# Patient Record
Sex: Female | Born: 1990 | Race: Black or African American | Hispanic: No | Marital: Single | Smoking: Current every day smoker
Health system: Southern US, Community
[De-identification: ages and names within clinical notes are randomized; demographics above are authoritative.]

## PROBLEM LIST (undated history)

## (undated) ENCOUNTER — Inpatient Hospital Stay (HOSPITAL_COMMUNITY): Payer: Self-pay

## (undated) DIAGNOSIS — A599 Trichomoniasis, unspecified: Secondary | ICD-10-CM

## (undated) DIAGNOSIS — A749 Chlamydial infection, unspecified: Secondary | ICD-10-CM

## (undated) DIAGNOSIS — F329 Major depressive disorder, single episode, unspecified: Secondary | ICD-10-CM

## (undated) DIAGNOSIS — D649 Anemia, unspecified: Secondary | ICD-10-CM

## (undated) DIAGNOSIS — R87629 Unspecified abnormal cytological findings in specimens from vagina: Secondary | ICD-10-CM

## (undated) HISTORY — PX: INDUCED ABORTION: SHX677

## (undated) HISTORY — PX: WISDOM TOOTH EXTRACTION: SHX21

---
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
## 2010-03-06 ENCOUNTER — Emergency Department (HOSPITAL_COMMUNITY): Admission: EM | Admit: 2010-03-06 | Discharge: 2010-03-06 | Payer: Self-pay | Admitting: Emergency Medicine

## 2010-07-02 ENCOUNTER — Emergency Department (HOSPITAL_COMMUNITY)
Admission: EM | Admit: 2010-07-02 | Discharge: 2010-07-02 | Payer: Self-pay | Source: Home / Self Care | Admitting: Emergency Medicine

## 2010-09-26 ENCOUNTER — Telehealth (INDEPENDENT_AMBULATORY_CARE_PROVIDER_SITE_OTHER): Payer: Self-pay | Admitting: Internal Medicine

## 2010-10-25 NOTE — Letter (Signed)
Summary: REFERRAL//PHYCHOLOGY //APPT DATE &* TIME  REFERRAL//PHYCHOLOGY //APPT DATE &* TIME   Imported By: Arta Bruce 02/15/2010 15:21:41  _____________________________________________________________________  External Attachment:    Type:   Image     Comment:   External Document

## 2010-10-25 NOTE — Progress Notes (Signed)
Summary: Office Visit//depression screening  Office Visit//depression screening   Imported By: Arta Bruce 11/19/2009 14:18:10  _____________________________________________________________________  External Attachment:    Type:   Image     Comment:   External Document
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
## 2010-10-28 ENCOUNTER — Encounter (INDEPENDENT_AMBULATORY_CARE_PROVIDER_SITE_OTHER): Payer: Self-pay | Admitting: Internal Medicine

## 2010-11-02 NOTE — Progress Notes (Signed)
Summary: re depression  Phone Note Outgoing Call   Summary of Call: Please call Ms. Blackstock and see if she is following anywhere for he depression.  Saw Marchelle Folks here once, then never returned.  Please check with her to see if Warnell Forester is being followed at [facility]. Initial call taken by: Julieanne Manson MD,  September 26, 2010 3:27 PM  Follow-up for Phone Call        [phone] not a working # Gaylyn Cheers RN  September 27, 2010 1:40 PM   number is disconnected. called [phone] did not get an answer.Marland KitchenMarland KitchenArmenia Shannon  September 29, 2010 11:07 AM  Levon Hedger  October 03, 2010 3:50 PM No answer at [phone]   Additional Follow-up for Phone Call Additional follow up Details #1::        [phone] # or code dialed is incorrect, [phone] no answer, [phone] contact no longer works there, contact # [phone] Left message on answer machine for pt. to return call. Gaylyn Cheers RN  October 05, 2010 9:45 AM   Left message with female [phone] (pt's work #)        for patient to return call. Gaylyn Cheers RN  October 06, 2010 9:42 AM           Additional Follow-up for Phone Call Additional follow up Details #2::    Please send out a letter as we have not received a call yet.  Julieanne Manson MD  October 28, 2010 6:17 PM

## 2010-11-02 NOTE — Letter (Signed)
Summary: *HSN Results Follow up  [facility]  [address] [place], [place] [address]   Phone: [phone]  Fax: [phone]      10/28/2010   Tiffany Villegas [address] [place], [place]  [address]   Dear  [name],                            ____S.Drinkard,FNP   ____D. Gore,FNP       ____B. McPherson,MD   ____V. Rankins,MD    __X__E. Kester Stimpson,MD    ____N. Daphine Deutscher, FNP  ____D. Reche Dixon, MD    ____K. Philipp Deputy, MD    ____Other     This letter is to inform you that your recent test(s):  _______Pap Smear    _______Lab Test     _______X-ray    _______ is within acceptable limits  _______ requires a medication change  _______ requires a follow-up lab visit  _______ requires a follow-up visit with your provider   Comments:  We ha;ve been trying to reach you--please call  the office as soon as possible       _________________________________________________________ If you have any questions, please contact our office                     Sincerely,  Julieanne Manson MD [facility]

## 2010-12-04 ENCOUNTER — Emergency Department (HOSPITAL_COMMUNITY): Payer: Self-pay

## 2010-12-04 ENCOUNTER — Emergency Department (HOSPITAL_COMMUNITY)
Admission: EM | Admit: 2010-12-04 | Discharge: 2010-12-04 | Disposition: A | Discharge status: Home / Self Care | Payer: Self-pay | Attending: Emergency Medicine | Admitting: Emergency Medicine

## 2010-12-04 DIAGNOSIS — R059 Cough, unspecified: Secondary | ICD-10-CM | POA: Insufficient documentation

## 2010-12-04 DIAGNOSIS — B9789 Other viral agents as the cause of diseases classified elsewhere: Secondary | ICD-10-CM | POA: Insufficient documentation

## 2010-12-04 DIAGNOSIS — R112 Nausea with vomiting, unspecified: Secondary | ICD-10-CM | POA: Insufficient documentation

## 2010-12-04 DIAGNOSIS — R05 Cough: Secondary | ICD-10-CM | POA: Insufficient documentation

## 2010-12-04 LAB — CBC
HCT: 37.6 % (ref 36.0–46.0)
Hemoglobin: 12.1 g/dL (ref 12.0–15.0)
MCH: 29.2 pg (ref 26.0–34.0)
MCHC: 32.2 g/dL (ref 30.0–36.0)
MCV: 90.6 fL (ref 78.0–100.0)
Platelets: 297 K/uL (ref 150–400)
RBC: 4.15 MIL/uL (ref 3.87–5.11)
RDW: 12.5 % (ref 11.5–15.5)
WBC: 6.4 10*3/uL (ref 4.0–10.5)

## 2010-12-04 LAB — URINALYSIS, ROUTINE W REFLEX MICROSCOPIC
Bilirubin Urine: NEGATIVE
Glucose, UA: NEGATIVE mg/dL
Ketones, ur: NEGATIVE mg/dL
Leukocytes, UA: NEGATIVE
Nitrite: NEGATIVE
Protein, ur: NEGATIVE mg/dL
Specific Gravity, Urine: 1.026 (ref 1.005–1.030)
Urobilinogen, UA: 1 mg/dL (ref 0.0–1.0)
pH: 6.5 (ref 5.0–8.0)

## 2010-12-04 LAB — COMPREHENSIVE METABOLIC PANEL WITH GFR
AST: 19 U/L (ref 0–37)
Albumin: 3.5 g/dL (ref 3.5–5.2)
BUN: 11 mg/dL (ref 6–23)
CO2: 25 meq/L (ref 19–32)
Calcium: 8.8 mg/dL (ref 8.4–10.5)
Creatinine, Ser: 0.88 mg/dL (ref 0.4–1.2)
GFR calc Af Amer: >60 mL/min (ref >60)
GFR calc non Af Amer: >60 mL/min (ref >60)

## 2010-12-04 LAB — DIFFERENTIAL
Basophils Absolute: 0 10*3/uL (ref 0.0–0.1)
Basophils Relative: 1 % (ref 0–1)
Eosinophils Absolute: 0.2 K/uL (ref 0.0–0.7)
Eosinophils Relative: 3 % (ref 0–5)
Lymphocytes Relative: 32 % (ref 12–46)
Lymphs Abs: 2.1 K/uL (ref 0.7–4.0)
Monocytes Absolute: 0.8 K/uL (ref 0.1–1.0)
Monocytes Relative: 12 % (ref 3–12)
Neutro Abs: 3.4 10*3/uL (ref 1.7–7.7)
Neutrophils Relative %: 53 % (ref 43–77)

## 2010-12-04 LAB — COMPREHENSIVE METABOLIC PANEL
ALT: 14 U/L (ref 0–35)
Alkaline Phosphatase: 42 U/L (ref 39–117)
Chloride: 109 mEq/L (ref 96–112)
Glucose, Bld: 86 mg/dL (ref 70–99)
Potassium: 4.1 mEq/L (ref 3.5–5.1)
Sodium: 140 mEq/L (ref 135–145)
Total Bilirubin: 0.3 mg/dL (ref 0.3–1.2)
Total Protein: 6.6 g/dL (ref 6.0–8.3)

## 2010-12-04 LAB — URINE MICROSCOPIC-ADD ON

## 2010-12-04 LAB — POCT I-STAT, CHEM 8
BUN: 11 mg/dL (ref 6–23)
Chloride: 108 mEq/L (ref 96–112)
Creatinine, Ser: 1 mg/dL (ref 0.4–1.2)
Hemoglobin: 13.3 g/dL (ref 12.0–15.0)
Potassium: 4.2 mEq/L (ref 3.5–5.1)
Sodium: 141 mEq/L (ref 135–145)

## 2010-12-04 LAB — POCT PREGNANCY, URINE: Preg Test, Ur: NEGATIVE

## 2010-12-28 LAB — COMPREHENSIVE METABOLIC PANEL
ALT: 15 U/L (ref 0–35)
AST: 16 U/L (ref 0–37)
Albumin: 3.7 g/dL (ref 3.5–5.2)
Albumin: 3.9 g/dL (ref 3.5–5.2)
Alkaline Phosphatase: 57 U/L (ref 39–117)
BUN: 11 mg/dL (ref 6–23)
CO2: 26 mEq/L (ref 19–32)
Chloride: 108 mEq/L (ref 96–112)
Creatinine, Ser: 0.86 mg/dL (ref 0.4–1.2)
Creatinine, Ser: 0.96 mg/dL (ref 0.4–1.2)
GFR calc Af Amer: >60 mL/min (ref >60)
GFR calc non Af Amer: >60 mL/min (ref >60)
Potassium: 3.8 mEq/L (ref 3.5–5.1)
Potassium: 4.3 mEq/L (ref 3.5–5.1)
Sodium: 140 mEq/L (ref 135–145)
Total Bilirubin: 0.8 mg/dL (ref 0.3–1.2)
Total Protein: 7.1 g/dL (ref 6.0–8.3)

## 2010-12-28 LAB — URINE CULTURE
Culture: NO GROWTH
Special Requests: POSITIVE

## 2010-12-28 LAB — URINALYSIS, ROUTINE W REFLEX MICROSCOPIC
Bilirubin Urine: NEGATIVE
Glucose, UA: NEGATIVE mg/dL
Ketones, ur: 15 mg/dL — AB
Nitrite: NEGATIVE
Protein, ur: NEGATIVE mg/dL
Specific Gravity, Urine: 1.025 (ref 1.005–1.030)
Urobilinogen, UA: 1 mg/dL (ref 0.0–1.0)
pH: 6 (ref 5.0–8.0)

## 2010-12-28 LAB — DIFFERENTIAL
Basophils Absolute: 0 10*3/uL (ref 0.0–0.1)
Eosinophils Absolute: 0.1 10*3/uL (ref 0.0–0.7)
Eosinophils Relative: 1 % (ref 0–5)
Lymphocytes Relative: 12 % (ref 12–46)
Lymphocytes Relative: 45 % (ref 12–46)
Lymphs Abs: 1.3 10*3/uL (ref 0.7–4.0)
Monocytes Absolute: 0.2 10*3/uL (ref 0.1–1.0)
Monocytes Absolute: 0.3 10*3/uL (ref 0.1–1.0)
Monocytes Relative: 2 % — ABNORMAL LOW (ref 3–12)
Neutro Abs: 9.4 10*3/uL — ABNORMAL HIGH (ref 1.7–7.7)

## 2010-12-28 LAB — CBC
HCT: 29.7 % — ABNORMAL LOW (ref 36.0–46.0)
HCT: 37.2 % (ref 36.0–46.0)
Hemoglobin: 10.1 g/dL — ABNORMAL LOW (ref 12.0–15.0)
MCV: 88.7 fL (ref 78.0–100.0)
Platelets: 235 10*3/uL (ref 150–400)
Platelets: 258 10*3/uL (ref 150–400)
Platelets: 373 10*3/uL (ref 150–400)
RBC: 4.35 MIL/uL (ref 3.87–5.11)
RDW: 13.1 % (ref 11.5–15.5)
WBC: 13.6 10*3/uL — ABNORMAL HIGH (ref 4.0–10.5)
WBC: 5.3 10*3/uL (ref 4.0–10.5)

## 2010-12-28 LAB — CULTURE, BLOOD (ROUTINE X 2)
Culture: NO GROWTH
Culture: NO GROWTH

## 2010-12-28 LAB — URINE MICROSCOPIC-ADD ON

## 2010-12-28 LAB — GC/CHLAMYDIA PROBE AMP, GENITAL
Chlamydia, DNA Probe: NEGATIVE
GC Probe Amp, Genital: POSITIVE — AB

## 2010-12-28 LAB — WET PREP, GENITAL: Trich, Wet Prep: NONE SEEN
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
## 2011-03-19 ENCOUNTER — Emergency Department (HOSPITAL_COMMUNITY)
Admission: EM | Admit: 2011-03-19 | Discharge: 2011-03-19 | Disposition: A | Discharge status: Home / Self Care | Payer: Self-pay | Attending: Emergency Medicine | Admitting: Emergency Medicine

## 2011-03-19 DIAGNOSIS — N760 Acute vaginitis: Secondary | ICD-10-CM | POA: Insufficient documentation

## 2011-03-19 DIAGNOSIS — A499 Bacterial infection, unspecified: Secondary | ICD-10-CM | POA: Insufficient documentation

## 2011-03-19 DIAGNOSIS — B9689 Other specified bacterial agents as the cause of diseases classified elsewhere: Secondary | ICD-10-CM | POA: Insufficient documentation

## 2011-03-19 DIAGNOSIS — N390 Urinary tract infection, site not specified: Secondary | ICD-10-CM | POA: Insufficient documentation

## 2011-03-19 LAB — URINALYSIS, ROUTINE W REFLEX MICROSCOPIC
Nitrite: NEGATIVE
Specific Gravity, Urine: 1.029 (ref 1.005–1.030)
Urobilinogen, UA: 0.2 mg/dL (ref 0.0–1.0)
pH: 6.5 (ref 5.0–8.0)

## 2011-03-19 LAB — WET PREP, GENITAL

## 2011-03-19 LAB — URINE MICROSCOPIC-ADD ON

## 2011-03-19 LAB — POCT PREGNANCY, URINE: Preg Test, Ur: NEGATIVE

## 2011-03-20 ENCOUNTER — Emergency Department (HOSPITAL_COMMUNITY)
Admission: EM | Admit: 2011-03-20 | Discharge: 2011-03-20 | Disposition: A | Discharge status: Home / Self Care | Payer: Self-pay | Attending: Emergency Medicine | Admitting: Emergency Medicine

## 2011-03-20 DIAGNOSIS — N12 Tubulo-interstitial nephritis, not specified as acute or chronic: Secondary | ICD-10-CM | POA: Insufficient documentation

## 2011-03-21 LAB — GC/CHLAMYDIA PROBE AMP, GENITAL
Chlamydia, DNA Probe: UNDETERMINED
GC Probe Amp, Genital: NEGATIVE

## 2011-03-21 LAB — URINE CULTURE
Colony Count: NO GROWTH
Culture: NO GROWTH

## 2011-06-16 LAB — URINALYSIS, ROUTINE W REFLEX MICROSCOPIC
Bilirubin Urine: NEGATIVE
Ketones, ur: NEGATIVE
Nitrite: NEGATIVE
Protein, ur: NEGATIVE
Urobilinogen, UA: 0.2

## 2011-06-19 LAB — CREATININE, SERUM

## 2011-06-19 LAB — COMPREHENSIVE METABOLIC PANEL
ALT: 11
AST: 16
Albumin: 2.5 — ABNORMAL LOW
Alkaline Phosphatase: 478 — ABNORMAL HIGH
Alkaline Phosphatase: 495 — ABNORMAL HIGH
BUN: 11
BUN: 7
CO2: 22
Chloride: 108
Creatinine, Ser: 0.6
Glucose, Bld: 75
Potassium: 3.7
Potassium: 3.9
Sodium: 137
Total Bilirubin: 0.7
Total Protein: 5.6 — ABNORMAL LOW

## 2011-06-19 LAB — CBC
HCT: 33.5 — ABNORMAL LOW
HCT: 35.4 — ABNORMAL LOW
Hemoglobin: 11.7 — ABNORMAL LOW
Platelets: 211
RBC: 3.8
RDW: 13
RDW: 13.4
WBC: 6.9

## 2011-06-19 LAB — URINALYSIS, DIPSTICK ONLY
Glucose, UA: NEGATIVE
Ketones, ur: NEGATIVE
Leukocytes, UA: NEGATIVE
Nitrite: NEGATIVE
pH: 7

## 2011-06-19 LAB — URINALYSIS, ROUTINE W REFLEX MICROSCOPIC
Bilirubin Urine: NEGATIVE
Hgb urine dipstick: NEGATIVE
Ketones, ur: NEGATIVE
Protein, ur: NEGATIVE
Specific Gravity, Urine: 1.01
Urobilinogen, UA: 0.2

## 2011-06-19 LAB — URIC ACID
Uric Acid, Serum: 5
Uric Acid, Serum: 5.6

## 2011-06-19 LAB — RPR: RPR Ser Ql: NONREACTIVE

## 2011-07-03 LAB — CBC
HCT: 35.1 — ABNORMAL LOW
Hemoglobin: 11.8 — ABNORMAL LOW
MCHC: 33.7
MCV: 88.5
RBC: 3.97

## 2011-07-03 LAB — POCT I-STAT CREATININE
Creatinine, Ser: 0.7
Operator id: 196461

## 2011-07-03 LAB — I-STAT 8, (EC8 V) (CONVERTED LAB)
Bicarbonate: 22.8
Glucose, Bld: 95
TCO2: 24
pH, Ven: 7.351 — ABNORMAL HIGH

## 2011-07-10 LAB — COMPREHENSIVE METABOLIC PANEL
AST: 14
BUN: 7
CO2: 26
Chloride: 111
Creatinine, Ser: 0.75
Total Bilirubin: 0.5

## 2011-07-10 LAB — COMPREHENSIVE METABOLIC PANEL WITH GFR
ALT: 10
Albumin: 3.7
Alkaline Phosphatase: 37 — ABNORMAL LOW
Calcium: 8.8
Glucose, Bld: 124 — ABNORMAL HIGH
Potassium: 3.6
Sodium: 140
Total Protein: 6.6

## 2011-07-10 LAB — CBC
HCT: 36.1
Hemoglobin: 12.4
MCHC: 34.3 — ABNORMAL HIGH
MCV: 84.9
Platelets: 292
RBC: 4.25
RDW: 12.9
WBC: 7.3

## 2011-07-10 LAB — URINALYSIS, ROUTINE W REFLEX MICROSCOPIC
Bilirubin Urine: NEGATIVE
Glucose, UA: NEGATIVE
Hgb urine dipstick: NEGATIVE
Ketones, ur: NEGATIVE
Nitrite: NEGATIVE
Protein, ur: NEGATIVE
Specific Gravity, Urine: 1.023
Urobilinogen, UA: 0.2
pH: 6

## 2011-07-10 LAB — ETHANOL: Alcohol, Ethyl (B): <5

## 2011-07-10 LAB — RAPID URINE DRUG SCREEN, HOSP PERFORMED
Amphetamines: NOT DETECTED
Barbiturates: NOT DETECTED
Benzodiazepines: NOT DETECTED
Cocaine: NOT DETECTED
Opiates: NOT DETECTED
Tetrahydrocannabinol: NOT DETECTED

## 2011-07-10 LAB — HEPATIC FUNCTION PANEL
ALT: 10
AST: 13
Albumin: 3.6
Alkaline Phosphatase: 35 — ABNORMAL LOW
Indirect Bilirubin: 1 — ABNORMAL HIGH
Total Protein: 6.4

## 2011-07-10 LAB — URINE MICROSCOPIC-ADD ON

## 2011-07-10 LAB — POCT PREGNANCY, URINE
Operator id: 189501
Preg Test, Ur: POSITIVE

## 2011-07-10 LAB — DIFFERENTIAL
Basophils Absolute: 0.1
Basophils Relative: 1
Eosinophils Absolute: 0.1
Eosinophils Relative: 1
Lymphocytes Relative: 34
Lymphs Abs: 2.5
Monocytes Absolute: 0.3
Monocytes Relative: 5
Neutro Abs: 4.3
Neutrophils Relative %: 60

## 2011-07-10 LAB — RPR: RPR Ser Ql: NONREACTIVE

## 2011-07-10 LAB — HCG, QUANTITATIVE, PREGNANCY
hCG, Beta Chain, Quant, S: 122 — ABNORMAL HIGH
hCG, Beta Chain, Quant, S: 896 — ABNORMAL HIGH

## 2011-07-10 LAB — GAMMA GT: GGT: 10

## 2011-07-10 LAB — ACETAMINOPHEN LEVEL: Acetaminophen (Tylenol), Serum: <10 — ABNORMAL LOW

## 2011-07-10 LAB — SALICYLATE LEVEL: Salicylate Lvl: <4

## 2011-07-10 LAB — TSH: TSH: 1.061

## 2011-11-30 ENCOUNTER — Encounter (HOSPITAL_COMMUNITY): Payer: Self-pay | Admitting: Emergency Medicine

## 2011-11-30 ENCOUNTER — Emergency Department (HOSPITAL_COMMUNITY)
Admission: EM | Admit: 2011-11-30 | Discharge: 2011-11-30 | Disposition: A | Discharge status: Home / Self Care | Payer: Medicaid Other | Attending: Emergency Medicine | Admitting: Emergency Medicine

## 2011-11-30 DIAGNOSIS — Z2089 Contact with and (suspected) exposure to other communicable diseases: Secondary | ICD-10-CM | POA: Insufficient documentation

## 2011-11-30 DIAGNOSIS — F17200 Nicotine dependence, unspecified, uncomplicated: Secondary | ICD-10-CM | POA: Insufficient documentation

## 2011-11-30 DIAGNOSIS — R21 Rash and other nonspecific skin eruption: Secondary | ICD-10-CM | POA: Insufficient documentation

## 2011-11-30 MED ORDER — PERMETHRIN 5 % EX CREA: TOPICAL_CREAM | CUTANEOUS | Status: AC

## 2011-11-30 NOTE — Discharge Instructions (Signed)
Scabies Scabies are small bugs (mites) that burrow under the skin and cause red bumps and severe itching. These bugs can only be seen with a microscope. Scabies are highly contagious. They can spread easily from person to person by direct contact. They are also spread through sharing clothing or linens that have the scabies mites living in them. It is not unusual for an entire family to become infected through shared towels, clothing, or bedding.  HOME CARE INSTRUCTIONS   Your caregiver may prescribe a cream or lotion to kill the mites. If this cream is prescribed; massage the cream into the entire area of the body from the neck to the bottom of both feet. Also massage the cream into the scalp and face if your child is less than 1 year old. Avoid the eyes and mouth.   Leave the cream on for 8 to12 hours. Do not wash your hands after application. Your child should bathe or shower after the 8 to 12 hour application period. Sometimes it is helpful to apply the cream to your child at right before bedtime.   One treatment is usually effective and will eliminate approximately 95% of infestations. For severe cases, your caregiver may decide to repeat the treatment in 1 week. Everyone in your household should be treated with one application of the cream.   New rashes or burrows should not appear after successful treatment within 24 to 48 hours; however the itching and rash may last for 2 to 4 weeks after successful treatment. If your symptoms persist longer than this, see your caregiver.   Your caregiver also may prescribe a medication to help with the itching or to help the rash go away more quickly.   Scabies can live on clothing or linens for up to 3 days. Your entire child's recently used clothing, towels, stuffed toys, and bed linens should be washed in hot water and then dried in a dryer for at least 20 minutes on high heat. Items that cannot be washed should be enclosed in a plastic bag for at least 3  days.   To help relieve itching, bathe your child in a cool bath or apply cool washcloths to the affected areas.   Your child may return to school after treatment with the prescribed cream.  SEEK MEDICAL CARE IF:   The itching persists longer than 4 weeks after treatment.   The rash spreads or becomes infected (the area has red blisters or yellow-tan crust).  Document Released: 09/11/2005 Document Revised: 08/31/2011 Document Reviewed: 01/20/2009 [employer] Patient Information 2012 [employer]. 

## 2011-11-30 NOTE — ED Provider Notes (Signed)
History     CSN: [id number]  Arrival date & time 11/30/11  2053   First MD Initiated Contact with Patient 11/30/11 2317      Chief Complaint  Patient presents with  . Rash    (Consider location/radiation/quality/duration/timing/severity/associated sxs/prior treatment) Patient is a 21 y.o. female presenting with rash. The history is provided by the patient. No language interpreter was used.  Rash  This is a new problem. The current episode started more than 2 days ago. The problem has been gradually worsening. There has been no fever. The rash is present on the back, abdomen, right upper leg and left upper leg. The patient is experiencing no pain. Associated symptoms include blisters and itching. Risk factors include new environmental exposures.  Son at home diagnosed with scabies today.  History reviewed. No pertinent past medical history.  History reviewed. No pertinent past surgical history.  No family history on file.  History  Substance Use Topics  . Smoking status: Current Everyday Smoker  . Smokeless tobacco: Not on file  . Alcohol Use: Yes    OB History    Grav Para Term Preterm Abortions TAB SAB Ect Mult Living                  Review of Systems  Skin: Positive for itching and rash.  All other systems reviewed and are negative.    Allergies  Review of patient's allergies indicates no known allergies.  Home Medications   Current Outpatient Rx  Name Route Sig Dispense Refill  . HYDROCORTISONE 1 % EX CREA Topical Apply 1 application topically 2 (two) times daily as needed. For itching      BP 133/87  Pulse 124  Temp(Src) 98.8 F (37.1 C) (Oral)  Resp 18  SpO2 100%  LMP 10/30/2011  Physical Exam  Nursing note and vitals reviewed. Constitutional: She is oriented to person, place, and time. She appears well-developed and well-nourished.  HENT:  Head: Normocephalic and atraumatic.  Eyes: Pupils are equal, round, and reactive to light.  Neck: Normal  range of motion. Neck supple.  Cardiovascular: Normal rate, regular rhythm, normal heart sounds and intact distal pulses.   Pulmonary/Chest: Effort normal and breath sounds normal.  Abdominal: Soft. Bowel sounds are normal.  Musculoskeletal: Normal range of motion.  Neurological: She is alert and oriented to person, place, and time.  Skin: Rash noted.  Psychiatric: She has a normal mood and affect. Her behavior is normal. Judgment and thought content normal.    ED Course  Procedures (including critical care time)  Labs Reviewed - No data to display No results found.   No diagnosis found.  Likely scabies exposure.  MDM          Jimmye Norman, NP 11/30/11 2336  Medical screening examination/treatment/procedure(s) were performed by non-physician practitioner and as supervising physician I was immediately available for consultation/collaboration.   Sunnie Nielsen, MD 12/01/11 [phone]

## 2011-11-30 NOTE — ED Notes (Signed)
PT. REPORTS ITCHY RASHES AT BACK AND THIGH FOR 1 WEEK CONCERNED ABOUT SCABIES , STATES SON DIAGNOSED WITH SCABIES TODAY.

## 2012-03-13 ENCOUNTER — Encounter (HOSPITAL_COMMUNITY): Payer: Self-pay | Admitting: *Deleted

## 2012-03-13 ENCOUNTER — Inpatient Hospital Stay (HOSPITAL_COMMUNITY)
Admission: AD | Admit: 2012-03-13 | Discharge: 2012-03-13 | Disposition: A | Discharge status: Home / Self Care | Payer: Medicaid Other | Source: Ambulatory Visit | Attending: Obstetrics & Gynecology | Admitting: Obstetrics & Gynecology

## 2012-03-13 DIAGNOSIS — R1010 Upper abdominal pain, unspecified: Secondary | ICD-10-CM

## 2012-03-13 DIAGNOSIS — Z3201 Encounter for pregnancy test, result positive: Secondary | ICD-10-CM

## 2012-03-13 DIAGNOSIS — O99891 Other specified diseases and conditions complicating pregnancy: Secondary | ICD-10-CM | POA: Insufficient documentation

## 2012-03-13 DIAGNOSIS — R109 Unspecified abdominal pain: Secondary | ICD-10-CM | POA: Insufficient documentation

## 2012-03-13 HISTORY — DX: Chlamydial infection, unspecified: A74.9

## 2012-03-13 LAB — URINALYSIS, ROUTINE W REFLEX MICROSCOPIC
Glucose, UA: NEGATIVE mg/dL
Ketones, ur: NEGATIVE mg/dL
Nitrite: NEGATIVE
Specific Gravity, Urine: 1.02 (ref 1.005–1.030)
pH: 6 (ref 5.0–8.0)

## 2012-03-13 LAB — URINE MICROSCOPIC-ADD ON

## 2012-03-13 LAB — POCT PREGNANCY, URINE: Preg Test, Ur: POSITIVE — AB

## 2012-03-13 LAB — WET PREP, GENITAL

## 2012-03-13 NOTE — MAU Provider Note (Signed)
History     CSN: [id number]  Arrival date and time: 03/13/12 1452   First Provider Initiated Contact with Patient 03/13/12 1536      Chief Complaint  Patient presents with  . Nausea   HPI Tiffany Villegas 21 y.o. [redacted]w[redacted]d Comes as she had a positive pregnancy test at home.  Is having upper abdominal pain.  No vaginal bleeding.  No lower abdominal pain.  No nausea or vomiting.  No discharge.  Does want STD testing.   OB History    Grav Para Term Preterm Abortions TAB SAB Ect Mult Living   3 1 1  0 0 0 0 0 0 1      Past Medical History  Diagnosis Date  . Chlamydia     History reviewed. No pertinent past surgical history.  Family History  Problem Relation Age of Onset  . Hypertension Mother     History  Substance Use Topics  . Smoking status: Current Everyday Smoker -- 0.2 packs/day    Types: Cigarettes  . Smokeless tobacco: Never Used  . Alcohol Use: Yes     Occas., but not recent    Allergies: No Known Allergies  Prescriptions prior to admission  Medication Sig Dispense Refill  . hydrocortisone cream 1 % Apply 1 application topically 2 (two) times daily as needed. For itching        ROS Physical Exam   Blood pressure 116/68, pulse 119, temperature 98.9 F (37.2 C), temperature source Oral, resp. rate 16, height 5\' 3"  (1.6 m), weight 115 lb 6.4 oz (52.345 kg), last menstrual period 02/05/2012.  Physical Exam  Nursing note and vitals reviewed. Constitutional: She is oriented to person, place, and time. She appears well-developed and well-nourished.  HENT:  Head: Normocephalic.  Eyes: EOM are normal.  Neck: Neck supple.  GI: Soft. There is no tenderness.  Genitourinary:       Speculum exam: Vagina - Small amount of creamy discharge, no odor Cervix - No contact bleeding Bimanual exam: Cervix closed Uterus non tender, 6 week size Adnexa non tender, no masses bilaterally GC/Chlam, wet prep done Chaperone present for exam.  Musculoskeletal: Normal  range of motion.  Neurological: She is alert and oriented to person, place, and time.  Skin: Skin is warm and dry.  Psychiatric: She has a normal mood and affect.    MAU Course  Procedures  MDM Results for orders placed during the hospital encounter of 03/13/12 (from the past 24 hour(s))  URINALYSIS, ROUTINE W REFLEX MICROSCOPIC     Status: Abnormal   Collection Time   03/13/12  3:05 PM      Component Value Range   Color, Urine YELLOW  YELLOW   APPearance HAZY (*) CLEAR   Specific Gravity, Urine 1.020  1.005 - 1.030   pH 6.0  5.0 - 8.0   Glucose, UA NEGATIVE  NEGATIVE mg/dL   Hgb urine dipstick TRACE (*) NEGATIVE   Bilirubin Urine NEGATIVE  NEGATIVE   Ketones, ur NEGATIVE  NEGATIVE mg/dL   Protein, ur NEGATIVE  NEGATIVE mg/dL   Urobilinogen, UA 0.2  0.0 - 1.0 mg/dL   Nitrite NEGATIVE  NEGATIVE   Leukocytes, UA TRACE (*) NEGATIVE  URINE MICROSCOPIC-ADD ON     Status: Abnormal   Collection Time   03/13/12  3:05 PM      Component Value Range   Squamous Epithelial / LPF MANY (*) RARE   WBC, UA 0-2  <3 WBC/hpf   RBC / HPF 0-2  <  3 RBC/hpf   Bacteria, UA MANY (*) RARE   Urine-Other MUCOUS PRESENT    POCT PREGNANCY, URINE     Status: Abnormal   Collection Time   03/13/12  3:10 PM      Component Value Range   Preg Test, Ur POSITIVE (*) NEGATIVE  WET PREP, GENITAL     Status: Abnormal   Collection Time   03/13/12  4:00 PM      Component Value Range   Yeast Wet Prep HPF POC NONE SEEN  NONE SEEN   Trich, Wet Prep NONE SEEN  NONE SEEN   Clue Cells Wet Prep HPF POC MODERATE (*) NONE SEEN   WBC, Wet Prep HPF POC FEW (*) NONE SEEN     Assessment and Plan  Early pregnancy Upper abdominal pain likely indigestion  Plan May take Tums for upper abdominal pain Begin prenatal care as soon as possible.  Client thinks she will have a TAB.  Venice Marcucci 03/13/2012, 4:13 PM

## 2012-03-13 NOTE — MAU Note (Signed)
LMP 02/05/12 has missed period this month has been having some nausea.

## 2012-03-13 NOTE — MAU Note (Signed)
Patient would like to be checked STDs while here.

## 2012-03-13 NOTE — Discharge Instructions (Signed)
Your pregnancy test is positive.  No smoking, no drugs, no alcohol.  Take a prenatal vitamin one by mouth every day.  Eat small frequent snacks to avoid nausea.  Begin prenatal care as soon as possible. Return if you have severe pain or heavy vaginal bleeding.

## 2012-03-14 LAB — GC/CHLAMYDIA PROBE AMP, GENITAL: Chlamydia, DNA Probe: NEGATIVE

## 2012-09-25 DIAGNOSIS — A749 Chlamydial infection, unspecified: Secondary | ICD-10-CM

## 2012-09-25 HISTORY — DX: Chlamydial infection, unspecified: A74.9

## 2012-09-25 NOTE — L&D Delivery Note (Signed)
Delivery Note At 5:20 PM a viable female was delivered via Vaginal, Spontaneous Delivery (Presentation: ROA).  APGAR: 9, 9; weight pending. Nuchal cord easily reduced. Baby direct skin-to-skin on maternal abdomen. Cord: Long and thick with 3 vessels clamped x2 and cut by myself, as mother did not want to cut it. Cord blood obtained. Intact placenta delivered shortly thereafter without complication. Cord pH not obtained.   Anesthesia: Epidural  Episiotomy: None Lacerations: None Suture Repair: N/A Est. Blood Loss (mL): 250  Mom to postpartum.  Baby to Couplet care / Skin to Skin.  Caren Griffins, CNM supervised the delivery in total.  [name] 09/09/2013, 5:39 PM Evaluation and management procedures were performed by Resident physician under my supervision/collaboration. Chart reviewed, patient examined by me and I agree with management and plan.  Danae Orleans, CNM 09/09/2013 6:11 PM

## 2012-11-10 ENCOUNTER — Encounter (HOSPITAL_COMMUNITY): Payer: Self-pay | Admitting: Emergency Medicine

## 2012-11-10 ENCOUNTER — Emergency Department (HOSPITAL_COMMUNITY)
Admission: EM | Admit: 2012-11-10 | Discharge: 2012-11-10 | Disposition: A | Discharge status: Home / Self Care | Payer: Medicaid Other | Attending: Emergency Medicine | Admitting: Emergency Medicine

## 2012-11-10 DIAGNOSIS — H5789 Other specified disorders of eye and adnexa: Secondary | ICD-10-CM | POA: Insufficient documentation

## 2012-11-10 DIAGNOSIS — H00016 Hordeolum externum left eye, unspecified eyelid: Secondary | ICD-10-CM

## 2012-11-10 DIAGNOSIS — H00019 Hordeolum externum unspecified eye, unspecified eyelid: Secondary | ICD-10-CM | POA: Insufficient documentation

## 2012-11-10 DIAGNOSIS — Z8619 Personal history of other infectious and parasitic diseases: Secondary | ICD-10-CM | POA: Insufficient documentation

## 2012-11-10 DIAGNOSIS — F17200 Nicotine dependence, unspecified, uncomplicated: Secondary | ICD-10-CM | POA: Insufficient documentation

## 2012-11-10 MED ORDER — TOBRAMYCIN 0.3 % OP OINT
TOPICAL_OINTMENT | Freq: Once | OPHTHALMIC | Status: AC
  Administered 2012-11-10: 14:00:00 via OPHTHALMIC
  Filled 2012-11-10: qty 3.5

## 2012-11-10 NOTE — ED Notes (Signed)
Pt c/o left eye irritation x 3 days. Pt reports eyelid with yellow crust.

## 2012-11-10 NOTE — ED Notes (Signed)
Patient wears prescription glasses and did not bring them.  Unable to do eye test because she needs her glasses to see

## 2012-11-10 NOTE — ED Provider Notes (Signed)
History     CSN: [id number]  Arrival date & time 11/10/12  1112   First MD Initiated Contact with Patient 11/10/12 1254      Chief Complaint  Patient presents with  . Eye Problem    (Consider location/radiation/quality/duration/timing/severity/associated sxs/prior treatment) HPI Comments: Tiffany Villegas is a 22 y.o. Female presenting with swelling of her upper eyelid for the past 3 days,  Eye irritation and waking today with yellow crusting causing difficulty opening this eye.  She reports her left eye feels dry when she blinks and has increased redness of the eye along with continued drainage of clear tears today.  She has had a stye previously at the site of the eyelid swelling,  But the present swelling is bigger than the last episode.  She has tried to squeeze the area to get it to drain without success.  She denies fevers, nasal congestion,  Sinus pain, ear pain and has had no changes in her visual acuity.  She does wear glasses but does not have them with her today.  She does not wear contacts.  The history is provided by the patient.    Past Medical History  Diagnosis Date  . Chlamydia     Past Surgical History  Procedure Laterality Date  . Induced abortion      Family History  Problem Relation Age of Onset  . Hypertension Mother     History  Substance Use Topics  . Smoking status: Current Every Day Smoker -- 0.25 packs/day    Types: Cigarettes  . Smokeless tobacco: Never Used  . Alcohol Use: Yes     Comment: Occas., but not recent    OB History   Grav Para Term Preterm Abortions TAB SAB Ect Mult Living   3 1 1  0 0 0 0 0 0 1      Review of Systems  Constitutional: Negative for fever and chills.  HENT: Negative for ear pain, congestion, sore throat, facial swelling, neck pain and sinus pressure.   Eyes: Positive for discharge and redness. Negative for visual disturbance.  Respiratory: Negative.   Cardiovascular: Negative.   Gastrointestinal: Negative.    Musculoskeletal: Negative.   Skin: Negative for rash and wound.  Neurological: Negative for dizziness, weakness, light-headedness, numbness and headaches.  Psychiatric/Behavioral: Negative.     Allergies  Review of patient's allergies indicates no known allergies.  Home Medications  No current outpatient prescriptions on file.  BP 116/77  Pulse 103  Temp(Src) 98.3 F (36.8 C) (Oral)  Resp 18  SpO2 100%  LMP 10/29/2011  Breastfeeding? Unknown  Physical Exam  Nursing note and vitals reviewed. Constitutional: She appears well-developed and well-nourished.  HENT:  Head: Normocephalic and atraumatic.  Right Ear: Tympanic membrane normal.  Left Ear: Tympanic membrane normal.  Nose: Nose normal.  Mouth/Throat: Uvula is midline and oropharynx is clear and moist.  Eyes: Pupils are equal, round, and reactive to light. No foreign bodies found. Left eye exhibits hordeolum. Left eye exhibits no chemosis. No foreign body present in the left eye. Left conjunctiva is injected. Left eye exhibits normal extraocular motion.  Small stye left medial upper lid margin.  No fluctuance or pointing, no spreading erythema.  Conjunctiva is mildly uniformly erythematous.  There is yellow dried discharge along the eyelashes.    Neck: Normal range of motion.  Cardiovascular: Normal rate, regular rhythm, normal heart sounds and intact distal pulses.   Pulmonary/Chest: Effort normal and breath sounds normal. She has no wheezes.  Abdominal:  Soft. Bowel sounds are normal. There is no tenderness.  Musculoskeletal: Normal range of motion.  Neurological: She is alert.  Skin: Skin is warm and dry.  Psychiatric: She has a normal mood and affect.    ED Course  Procedures (including critical care time)  Labs Reviewed - No data to display No results found.   1. Stye, left       MDM  Simple stye without exam findings suggesting improvement could be made with needle aspiration.  Given generalized  conjunctivitis and report of eye discharge,  Will also cover for possible conjunctivitis.  Advised to use warm compresses,  Wash twice daily with baby shampoo.  Given erythromycin ophthal ointment to apply bid to eye and eyelids bid after washing.  Advised to avoid rubbing or squeezing.  Get rechecked if stye enlarges or does not resolve over the several days.        Burgess Amor, PA 11/11/12 1135

## 2012-11-11 NOTE — ED Provider Notes (Signed)
Medical screening examination/treatment/procedure(s) were performed by non-physician practitioner and as supervising physician I was immediately available for consultation/collaboration.  Lovenia Debruler T Arlan Birks, MD 11/11/12 1416 

## 2012-12-06 ENCOUNTER — Emergency Department (HOSPITAL_COMMUNITY)
Admission: EM | Admit: 2012-12-06 | Discharge: 2012-12-06 | Disposition: A | Discharge status: Home / Self Care | Payer: Medicaid Other | Attending: Emergency Medicine | Admitting: Emergency Medicine

## 2012-12-06 ENCOUNTER — Encounter (HOSPITAL_COMMUNITY): Payer: Self-pay | Admitting: Cardiology

## 2012-12-06 DIAGNOSIS — Z8619 Personal history of other infectious and parasitic diseases: Secondary | ICD-10-CM | POA: Insufficient documentation

## 2012-12-06 DIAGNOSIS — R1084 Generalized abdominal pain: Secondary | ICD-10-CM | POA: Insufficient documentation

## 2012-12-06 DIAGNOSIS — R112 Nausea with vomiting, unspecified: Secondary | ICD-10-CM | POA: Insufficient documentation

## 2012-12-06 DIAGNOSIS — F17200 Nicotine dependence, unspecified, uncomplicated: Secondary | ICD-10-CM | POA: Insufficient documentation

## 2012-12-06 DIAGNOSIS — R197 Diarrhea, unspecified: Secondary | ICD-10-CM | POA: Insufficient documentation

## 2012-12-06 DIAGNOSIS — Z3202 Encounter for pregnancy test, result negative: Secondary | ICD-10-CM | POA: Insufficient documentation

## 2012-12-06 LAB — CBC
MCH: 30.5 pg (ref 26.0–34.0)
MCV: 89 fL (ref 78.0–100.0)
Platelets: 310 10*3/uL (ref 150–400)
RDW: 12.6 % (ref 11.5–15.5)
WBC: 11.3 10*3/uL — ABNORMAL HIGH (ref 4.0–10.5)

## 2012-12-06 LAB — URINE MICROSCOPIC-ADD ON

## 2012-12-06 LAB — COMPREHENSIVE METABOLIC PANEL
AST: 18 U/L (ref 0–37)
Albumin: 4.3 g/dL (ref 3.5–5.2)
Calcium: 9.7 mg/dL (ref 8.4–10.5)
Creatinine, Ser: 0.95 mg/dL (ref 0.50–1.10)

## 2012-12-06 LAB — URINALYSIS, ROUTINE W REFLEX MICROSCOPIC
Hgb urine dipstick: NEGATIVE
Nitrite: NEGATIVE
Protein, ur: NEGATIVE mg/dL
Urobilinogen, UA: 0.2 mg/dL (ref 0.0–1.0)

## 2012-12-06 LAB — LIPASE, BLOOD: Lipase: 13 U/L (ref 11–59)

## 2012-12-06 MED ORDER — ONDANSETRON HCL 4 MG/2ML IJ SOLN
4.0000 mg | Freq: Once | INTRAMUSCULAR | Status: AC
  Administered 2012-12-06: 4 mg via INTRAVENOUS
  Filled 2012-12-06: qty 2

## 2012-12-06 MED ORDER — SODIUM CHLORIDE 0.9 % IV BOLUS (SEPSIS)
1000.0000 mL | Freq: Once | INTRAVENOUS | Status: AC
  Administered 2012-12-06: 1000 mL via INTRAVENOUS

## 2012-12-06 MED ORDER — PROMETHAZINE HCL 25 MG PO TABS: 25.0000 mg | ORAL_TABLET | Freq: Four times a day (QID) | ORAL | Status: DC | PRN

## 2012-12-06 NOTE — ED Notes (Signed)
Patient unable to void at this time

## 2012-12-06 NOTE — ED Notes (Signed)
Pt reports she has been vomiting since yesterday and also having abd cramping. States she has been unable to keep anything down. Pt reports some burning with urination.

## 2012-12-06 NOTE — ED Notes (Addendum)
Pt reports abdominal pain, nausea, vomiting, and diarrhea since last night. Reports abdominal pain as cramping and rates it 7/10. Pt alert and oriented x 4, neuro intact. Pt denies dysuria, unprotected sex, vaginal discharge or odor.

## 2012-12-06 NOTE — ED Provider Notes (Signed)
History     CSN: [id number]  Arrival date & time 12/06/12  1638   First MD Initiated Contact with Patient 12/06/12 2059      Chief Complaint  Patient presents with  . Emesis  . Abdominal Pain    (Consider location/radiation/quality/duration/timing/severity/associated sxs/prior treatment) HPI  ADASYN MCADAMS is a 22 y.o. female complaining of nausea vomiting and diarrhea starting yesterday. Her son is sick with similar illness. Patient denies fever,  palpitations She states that she has a 7/10 generalized cramping abdominal pain that is intermittent; this pain started after the vomiting.   Past Medical History  Diagnosis Date  . Chlamydia     Past Surgical History  Procedure Laterality Date  . Induced abortion      Family History  Problem Relation Age of Onset  . Hypertension Mother     History  Substance Use Topics  . Smoking status: Current Every Day Smoker -- 0.25 packs/day    Types: Cigarettes  . Smokeless tobacco: Never Used  . Alcohol Use: Yes     Comment: Occas., but not recent    OB History   Grav Para Term Preterm Abortions TAB SAB Ect Mult Living   3 1 1  0 0 0 0 0 0 1      Review of Systems  Constitutional: Negative for fever.  Respiratory: Negative for shortness of breath.   Cardiovascular: Negative for chest pain.  Gastrointestinal: Positive for nausea, vomiting, abdominal pain and diarrhea.  All other systems reviewed and are negative.    Allergies  Review of patient's allergies indicates no known allergies.  Home Medications  No current outpatient prescriptions on file.  BP 113/66  Pulse 131  Temp(Src) 97.9 F (36.6 C) (Oral)  Resp 14  SpO2 100%  LMP 10/29/2011  Physical Exam  Nursing note and vitals reviewed. Constitutional: She is oriented to person, place, and time. She appears well-developed and well-nourished. No distress.  HENT:  Head: Normocephalic.  Dry MM  Eyes: Conjunctivae and EOM are normal. Pupils are equal,  round, and reactive to light.  Neck: Normal range of motion.  Cardiovascular: Regular rhythm, normal heart sounds and intact distal pulses.   Tachycardia in the 110s  Pulmonary/Chest: Effort normal and breath sounds normal. No stridor. No respiratory distress. She has no wheezes. She has no rales. She exhibits no tenderness.  Abdominal: Soft. Bowel sounds are normal. She exhibits no distension and no mass. There is no tenderness. There is no rebound and no guarding.  Musculoskeletal: Normal range of motion.  Neurological: She is alert and oriented to person, place, and time.  Psychiatric: She has a normal mood and affect.    ED Course  Procedures (including critical care time)  Labs Reviewed  URINALYSIS, ROUTINE W REFLEX MICROSCOPIC - Abnormal; Notable for the following:    Color, Urine AMBER (*)    APPearance HAZY (*)    Specific Gravity, Urine 1.035 (*)    Bilirubin Urine SMALL (*)    Ketones, ur 15 (*)    Leukocytes, UA TRACE (*)    All other components within normal limits  CBC - Abnormal; Notable for the following:    WBC 11.3 (*)    Hemoglobin 15.2 (*)    All other components within normal limits  COMPREHENSIVE METABOLIC PANEL - Abnormal; Notable for the following:    Total Bilirubin 1.5 (*)    GFR calc non Af Amer 85 (*)    All other components within normal limits  URINE  MICROSCOPIC-ADD ON - Abnormal; Notable for the following:    Squamous Epithelial / LPF MANY (*)    Bacteria, UA FEW (*)    All other components within normal limits  URINE CULTURE  LIPASE, BLOOD  POCT PREGNANCY, URINE   No results found.  10:08 PM patient reports significant improvement in nausea. Heart rate has reduced to 104. By mouth challenge initiated  1. Nausea vomiting and diarrhea       MDM   Likely viral gastroenteritis. Patient appears to be dehydrated and tachy. Patient has no electrolyte abnormalities. She has a mild leukocytosis of 11.3. Plan is IV fluids, PO challenge  Patient  passed by mouth challenge, she remained slightly  tachycardic at 102. vision is stable and amenable for discharge at this time.   Pt verbalized understanding and agrees with care plan. Outpatient follow-up and return precautions given.    New Prescriptions   PROMETHAZINE (PHENERGAN) 25 MG TABLET    Take 1 tablet (25 mg total) by mouth every 6 (six) hours as needed for nausea.         Wynetta Emery, PA-C 12/06/12 2338

## 2012-12-07 NOTE — ED Provider Notes (Signed)
Medical screening examination/treatment/procedure(s) were performed by non-physician practitioner and as supervising physician I was immediately available for consultation/collaboration.  Jazlen Ogarro, MD 12/07/12 0041 

## 2012-12-08 LAB — URINE CULTURE: Colony Count: 9000

## 2013-01-18 ENCOUNTER — Inpatient Hospital Stay (HOSPITAL_COMMUNITY)
Admission: AD | Admit: 2013-01-18 | Discharge: 2013-01-18 | Disposition: A | Discharge status: Home / Self Care | Payer: Medicaid Other | Source: Ambulatory Visit | Attending: Obstetrics & Gynecology | Admitting: Obstetrics & Gynecology

## 2013-01-18 DIAGNOSIS — Z3201 Encounter for pregnancy test, result positive: Secondary | ICD-10-CM | POA: Insufficient documentation

## 2013-01-18 LAB — POCT PREGNANCY, URINE: Preg Test, Ur: POSITIVE — AB

## 2013-01-18 NOTE — MAU Provider Note (Signed)
  History     CSN: [id number]  Arrival date and time: 01/18/13 1608   None     Chief Complaint  Patient presents with  . Possible Pregnancy   HPI Tiffany Villegas 22 y.o. LMP last month.  Unsure if she is pregnant.  Wanted to give blood today if not pregnant.  OB History   Grav Para Term Preterm Abortions TAB SAB Ect Mult Living   3 1 1  0 0 0 0 0 0 1      Past Medical History  Diagnosis Date  . Chlamydia     Past Surgical History  Procedure Laterality Date  . Induced abortion      Family History  Problem Relation Age of Onset  . Hypertension Mother     History  Substance Use Topics  . Smoking status: Current Every Day Smoker -- 0.25 packs/day    Types: Cigarettes  . Smokeless tobacco: Never Used  . Alcohol Use: Yes     Comment: Occas., but not recent    Allergies: No Known Allergies  Prescriptions prior to admission  Medication Sig Dispense Refill  . promethazine (PHENERGAN) 25 MG tablet Take 1 tablet (25 mg total) by mouth every 6 (six) hours as needed for nausea.  12 tablet  0    Review of Systems  Constitutional: Negative for fever.  Gastrointestinal: Negative for abdominal pain.  Genitourinary:       No vaginal bleeding   Physical Exam   Blood pressure 127/79, pulse 107, temperature 98.2 F (36.8 C), temperature source Oral, resp. rate 18, height 5\' 3"  (1.6 m), weight 118 lb (53.524 kg).  Physical Exam  Nursing note and vitals reviewed. Constitutional: She is oriented to person, place, and time. She appears well-developed and well-nourished.  HENT:  Head: Normocephalic.  Eyes: EOM are normal.  Neck: Neck supple.  Musculoskeletal: Normal range of motion.  Neurological: She is alert and oriented to person, place, and time.  Skin: Skin is warm and dry.  Psychiatric: She has a normal mood and affect.    MAU Course  Procedures Results for orders placed during the hospital encounter of 01/18/13 (from the past 24 hour(s))  POCT PREGNANCY,  URINE     Status: Abnormal   Collection Time    01/18/13  4:32 PM      Result Value Range   Preg Test, Ur POSITIVE (*) NEGATIVE   MDM   Assessment and Plan  Positive urine pregnancy test  Plan Your pregnancy test is positive.  No smoking, no drugs, no alcohol.  Take a prenatal vitamin one by mouth every day.  Eat small frequent snacks to avoid nausea.  Begin prenatal care as soon as possible.   BURLESON,TERRI 01/18/2013, 4:44 PM

## 2013-01-18 NOTE — MAU Note (Addendum)
Pt reports she had negative pregnancy test at home. Wants to make sure. Not sure when her LMP sometime last month. No other problems

## 2013-01-18 NOTE — ED Notes (Signed)
Results of pregnancy test discussed by T.Burleson,NP  with pt. Pregnancy verification letter and provider list given.

## 2013-02-04 ENCOUNTER — Emergency Department (HOSPITAL_COMMUNITY)
Admission: EM | Admit: 2013-02-04 | Discharge: 2013-02-04 | Disposition: A | Discharge status: Home / Self Care | Payer: Medicaid Other | Attending: Emergency Medicine | Admitting: Emergency Medicine

## 2013-02-04 ENCOUNTER — Emergency Department (HOSPITAL_COMMUNITY): Payer: Medicaid Other

## 2013-02-04 ENCOUNTER — Encounter (HOSPITAL_COMMUNITY): Payer: Self-pay | Admitting: Emergency Medicine

## 2013-02-04 DIAGNOSIS — O9989 Other specified diseases and conditions complicating pregnancy, childbirth and the puerperium: Secondary | ICD-10-CM | POA: Insufficient documentation

## 2013-02-04 DIAGNOSIS — Z8619 Personal history of other infectious and parasitic diseases: Secondary | ICD-10-CM | POA: Insufficient documentation

## 2013-02-04 DIAGNOSIS — R109 Unspecified abdominal pain: Secondary | ICD-10-CM | POA: Insufficient documentation

## 2013-02-04 DIAGNOSIS — O99330 Smoking (tobacco) complicating pregnancy, unspecified trimester: Secondary | ICD-10-CM | POA: Insufficient documentation

## 2013-02-04 DIAGNOSIS — O200 Threatened abortion: Secondary | ICD-10-CM

## 2013-02-04 LAB — CBC
HCT: 32.4 % — ABNORMAL LOW (ref 36.0–46.0)
MCH: 29.1 pg (ref 26.0–34.0)
MCV: 86.6 fL (ref 78.0–100.0)
Platelets: 235 10*3/uL (ref 150–400)
RDW: 12 % (ref 11.5–15.5)

## 2013-02-04 LAB — ABO/RH: ABO/RH(D): AB POS

## 2013-02-04 NOTE — ED Notes (Signed)
Pt returned from US

## 2013-02-04 NOTE — ED Notes (Signed)
PT. REPORTS VAGINAL BLEEDING ONSET LAST NIGHT STATES [redacted] WEEKS PREGNANT ( G3P1 ) , UNSURE OF LMP , DENIES PAIN .

## 2013-02-04 NOTE — ED Provider Notes (Signed)
Medical screening examination/treatment/procedure(s) were performed by non-physician practitioner and as supervising physician I was immediately available for consultation/collaboration.  Olivia Mackie, MD 02/04/13 [phone]

## 2013-02-04 NOTE — ED Notes (Signed)
Patient unable to urinate for urine sample  

## 2013-02-04 NOTE — ED Provider Notes (Signed)
History     CSN: [id number]  Arrival date & time 02/04/13  0444   First MD Initiated Contact with Patient 02/04/13 0559      Chief Complaint  Patient presents with  . Vaginal Bleeding    (Consider location/radiation/quality/duration/timing/severity/associated sxs/prior treatment) HPI Comments: [id number], Patient presents emergency department with chief complaint of vaginal bleeding. She states that she believes she is pregnant. She states that her last menstrual period was approximately 5 weeks ago. She states the bleeding started about 2 hours ago. She states that she first noticed a, when she woke up this morning to use the bathroom, and noticed that her pants were bloody. She has since used 1 pad. She states that she is having some crampy lower abdominal pain. She denies any nausea, or vomiting. She states the pain is mild. She has not taken anything to alleviate her symptoms. Pain does not radiate.  The history is provided by the patient. No language interpreter was used.    Past Medical History  Diagnosis Date  . Chlamydia     Past Surgical History  Procedure Laterality Date  . Induced abortion      Family History  Problem Relation Age of Onset  . Hypertension Mother     History  Substance Use Topics  . Smoking status: Current Every Day Smoker -- 0.25 packs/day    Types: Cigarettes  . Smokeless tobacco: Never Used  . Alcohol Use: Yes     Comment: Occas., but not recent    OB History   Grav Para Term Preterm Abortions TAB SAB Ect Mult Living   3 1 1  0 0 0 0 0 0 1      Review of Systems  Allergies  Review of patient's allergies indicates no known allergies.  Home Medications   Current Outpatient Rx  Name  Route  Sig  Dispense  Refill  . Prenatal Vit-Fe Fumarate-FA (MULTIVITAMIN-PRENATAL) 27-0.8 MG TABS   Oral   Take 1 tablet by mouth daily at 12 noon.           BP 115/76  Pulse 109  Temp(Src) 99.4 F (37.4 C) (Oral)  Resp 14  SpO2  98%  Physical Exam  Nursing note and vitals reviewed. Constitutional: She is oriented to person, place, and time. She appears well-developed and well-nourished.  HENT:  Head: Normocephalic and atraumatic.  Eyes: Conjunctivae and EOM are normal. Pupils are equal, round, and reactive to light.  Neck: Normal range of motion. Neck supple.  Cardiovascular: Normal rate and regular rhythm.  Exam reveals no gallop and no friction rub.   No murmur heard. Pulmonary/Chest: Effort normal and breath sounds normal. No respiratory distress. She has no wheezes. She has no rales. She exhibits no tenderness.  Abdominal: Soft. Bowel sounds are normal. She exhibits no distension and no mass. There is no tenderness. There is no rebound and no guarding.  Genitourinary: No labial fusion. There is no rash, tenderness, lesion or injury on the right labia. There is no rash, tenderness, lesion or injury on the left labia. Uterus is not deviated, not enlarged, not fixed and not tender. Cervix exhibits discharge. Cervix exhibits no motion tenderness and no friability. Right adnexum displays no mass, no tenderness and no fullness. Left adnexum displays no mass, no tenderness and no fullness. No erythema, tenderness or bleeding around the vagina. No foreign body around the vagina. No signs of injury around the vagina. No vaginal discharge found.  The cervical os is closed, with mild  bloody discharge, no active hemorrhage  Musculoskeletal: Normal range of motion. She exhibits no edema and no tenderness.  Neurological: She is alert and oriented to person, place, and time.  Skin: Skin is warm and dry.  Psychiatric: She has a normal mood and affect. Her behavior is normal. Judgment and thought content normal.    ED Course  Procedures (including critical care time)  Labs Reviewed  WET PREP, GENITAL  GC/CHLAMYDIA PROBE AMP  HCG, QUANTITATIVE, PREGNANCY  CBC  ABO/RH   Results for orders placed during the hospital encounter  of 02/04/13  WET PREP, GENITAL      Result Value Range   Yeast Wet Prep HPF POC NONE SEEN  NONE SEEN   Trich, Wet Prep NONE SEEN  NONE SEEN   Clue Cells Wet Prep HPF POC NONE SEEN  NONE SEEN   WBC, Wet Prep HPF POC NONE SEEN  NONE SEEN  HCG, QUANTITATIVE, PREGNANCY      Result Value Range   hCG, Beta Chain, Quant, S 16109 (*) <5 mIU/mL  CBC      Result Value Range   WBC 7.8  4.0 - 10.5 K/uL   RBC 3.74 (*) 3.87 - 5.11 MIL/uL   Hemoglobin 10.9 (*) 12.0 - 15.0 g/dL   HCT 60.4 (*) 54.0 - 98.1 %   MCV 86.6  78.0 - 100.0 fL   MCH 29.1  26.0 - 34.0 pg   MCHC 33.6  30.0 - 36.0 g/dL   RDW 19.1  47.8 - 29.5 %   Platelets 235  150 - 400 K/uL  ABO/RH      Result Value Range   ABO/RH(D) AB POS     Antibody Identification ANTI-A1     PT AG Type NEGATIVE FOR A1 ANTIGEN     US Ob Comp Less 14 Wks  02/04/2013  *RADIOLOGY REPORT*  Clinical Data: Pregnant patient with vaginal bleeding. Quantitative HCG pending.  OBSTETRIC <14 WK [place] AND TRANSVAGINAL OB US  Technique:  Both transabdominal and transvaginal ultrasound examinations were performed for complete evaluation of the gestation as well as the maternal uterus, adnexal regions, and pelvic cul-de-sac.  Transvaginal technique was performed to assess early pregnancy.  Comparison:  None.  Intrauterine gestational sac:  Visualized/normal in shape. Small subchorionic hemorrhage is noted. Yolk sac: Visualized and appears normal. Embryo: Visualized. Cardiac Activity: Detected. Heart Rate: 116 bpm  CRL: 8  mm  6 w  5 d          [place] EDC: 09/25/2013  Maternal uterus/adnexae: Corpus luteum cyst on the left is noted.  IMPRESSION: Single living intrauterine pregnancy with a small subchorionic hemorrhage identified.   Original Report Authenticated By: Holley Dexter, M.D.    US Ob Transvaginal  02/04/2013  *RADIOLOGY REPORT*  Clinical Data: Pregnant patient with vaginal bleeding. Quantitative HCG pending.  OBSTETRIC <14 WK [place] AND TRANSVAGINAL OB US  Technique:  Both  transabdominal and transvaginal ultrasound examinations were performed for complete evaluation of the gestation as well as the maternal uterus, adnexal regions, and pelvic cul-de-sac.  Transvaginal technique was performed to assess early pregnancy.  Comparison:  None.  Intrauterine gestational sac:  Visualized/normal in shape. Small subchorionic hemorrhage is noted. Yolk sac: Visualized and appears normal. Embryo: Visualized. Cardiac Activity: Detected. Heart Rate: 116 bpm  CRL: 8  mm  6 w  5 d          [place] EDC: 09/25/2013  Maternal uterus/adnexae: Corpus luteum cyst on the left is noted.  IMPRESSION:  Single living intrauterine pregnancy with a small subchorionic hemorrhage identified.   Original Report Authenticated By: Holley Dexter, M.D.       1. Threatened abortion       MDM   Patient with vaginal bleeding. She is approximately [redacted] weeks pregnant. The pregnancy is IUP, no evidence of ectopic on ultrasound.  Patient and lab results discussed with Dr. Effie Shy.  Patient is having a threatened miscarriage, with the bleeding likely stemming from the subchorionic hemorrhage.  Discussed this with the patient, and told her that she needs to be [facility] for recheck of her hCG. Patient understands and agrees with the plan. She is stable and ready for discharge.      Roxy Horseman, PA-C 02/04/13 1215

## 2013-02-05 LAB — GC/CHLAMYDIA PROBE AMP: GC Probe RNA: NEGATIVE

## 2013-02-06 ENCOUNTER — Inpatient Hospital Stay (HOSPITAL_COMMUNITY)
Admission: AD | Admit: 2013-02-06 | Discharge: 2013-02-06 | Disposition: A | Discharge status: Home / Self Care | Payer: Medicaid Other | Source: Ambulatory Visit | Attending: Obstetrics and Gynecology | Admitting: Obstetrics and Gynecology

## 2013-02-06 ENCOUNTER — Encounter (HOSPITAL_COMMUNITY): Payer: Self-pay | Admitting: *Deleted

## 2013-02-06 DIAGNOSIS — R109 Unspecified abdominal pain: Secondary | ICD-10-CM | POA: Insufficient documentation

## 2013-02-06 DIAGNOSIS — O4690 Antepartum hemorrhage, unspecified, unspecified trimester: Secondary | ICD-10-CM

## 2013-02-06 DIAGNOSIS — O209 Hemorrhage in early pregnancy, unspecified: Secondary | ICD-10-CM | POA: Insufficient documentation

## 2013-02-06 NOTE — MAU Provider Note (Signed)
  History     CSN: [id number]  Arrival date and time: 02/06/13 1639   First Provider Initiated Contact with Patient 02/06/13 1733      Chief Complaint  Patient presents with  . Vaginal Bleeding  . Abdominal Cramping   HPI  Pt is G2P1001 7 weeks by ultrasound on 02/04/2013 when pt was seen at [facility] for vaginal bleeding.  Pt was also noted to have a small left CLC and small SCH.  Pt's bleeding has now almost subsided and has a small amount of brown discharge.  Pt denies cramping or pain at this time.  Pt has occ nausea.  Pt wants to know if everything is OK.  Pt has received confirmation of pregnancy and has applied for Medicaid.  Pt intends to go to [facility] for OB care. Pt had wet prep and G/chlamydia, (which were negative)  Past Medical History  Diagnosis Date  . Chlamydia     Past Surgical History  Procedure Laterality Date  . Induced abortion      Family History  Problem Relation Age of Onset  . Hypertension Mother     History  Substance Use Topics  . Smoking status: Current Every Day Smoker -- 0.25 packs/day    Types: Cigarettes  . Smokeless tobacco: Never Used  . Alcohol Use: Yes     Comment: Occas., but not recent    Allergies: No Known Allergies  Prescriptions prior to admission  Medication Sig Dispense Refill  . Prenatal Vit-Fe Fumarate-FA (MULTIVITAMIN-PRENATAL) 27-0.8 MG TABS Take 1 tablet by mouth daily at 12 noon.        Review of Systems  Gastrointestinal: Positive for nausea. Negative for vomiting, abdominal pain, diarrhea and constipation.  Genitourinary: Negative for dysuria.   Physical Exam   Blood pressure 115/70, pulse 106, temperature 98.8 F (37.1 C), temperature source Oral, resp. rate 18, last menstrual period 10/29/2011.  Physical Exam  Nursing note and vitals reviewed. Constitutional: She appears well-developed and well-nourished. No distress.  HENT:  Head: Normocephalic.  Eyes: Pupils are equal, round, and reactive to light.   Neck: Normal range of motion. Neck supple.  Cardiovascular: Normal rate.   Respiratory: Effort normal.  GI: Soft. She exhibits no distension. There is no tenderness. There is no rebound and no guarding.  Genitourinary:  Small amount of beige creamy discharge in vault; cervix clean, closed NT; uterus 6-8 week size, nontender; adnexa without palpable enlargement or tenderness  Musculoskeletal: Normal range of motion.  Neurological: She is alert.  Skin: Skin is warm and dry.  Psychiatric: She has a normal mood and affect.    MAU Course  Procedures    Assessment and Plan  Bleeding in early pregnancy- resovling- pt reassured Proceed with prenatal care  Elvin Mccartin 02/06/2013, 5:42 PM

## 2013-02-06 NOTE — MAU Note (Signed)
Seen a cone 2 days ago with complaint of heavy bright red bleeding and cramping; havin brownish spotting today with mild cramping but is concerned that she maybe trying to miscarry;

## 2013-02-07 NOTE — MAU Provider Note (Signed)
Attestation of Attending Supervision of Advanced Practitioner (CNM/NP): Evaluation and management procedures were performed by the Advanced Practitioner under my supervision and collaboration.  I have reviewed the Advanced Practitioner's note and chart, and I agree with the management and plan.  Meziah Blasingame 02/07/2013 7:16 AM

## 2013-02-10 ENCOUNTER — Inpatient Hospital Stay (HOSPITAL_COMMUNITY): Payer: Medicaid Other

## 2013-02-10 ENCOUNTER — Inpatient Hospital Stay (HOSPITAL_COMMUNITY)
Admission: AD | Admit: 2013-02-10 | Discharge: 2013-02-10 | Disposition: A | Discharge status: Home / Self Care | Payer: Medicaid Other | Source: Ambulatory Visit | Attending: Obstetrics & Gynecology | Admitting: Obstetrics & Gynecology

## 2013-02-10 ENCOUNTER — Encounter (HOSPITAL_COMMUNITY): Payer: Self-pay | Admitting: Advanced Practice Midwife

## 2013-02-10 DIAGNOSIS — O209 Hemorrhage in early pregnancy, unspecified: Secondary | ICD-10-CM | POA: Insufficient documentation

## 2013-02-10 DIAGNOSIS — O418X11 Other specified disorders of amniotic fluid and membranes, first trimester, fetus 1: Secondary | ICD-10-CM

## 2013-02-10 DIAGNOSIS — O43899 Other placental disorders, unspecified trimester: Secondary | ICD-10-CM | POA: Insufficient documentation

## 2013-02-10 DIAGNOSIS — O368990 Maternal care for other specified fetal problems, unspecified trimester, not applicable or unspecified: Secondary | ICD-10-CM

## 2013-02-10 LAB — CBC
HCT: 35.7 % — ABNORMAL LOW (ref 36.0–46.0)
MCV: 87.5 fL (ref 78.0–100.0)
Platelets: 267 10*3/uL (ref 150–400)
RBC: 4.08 MIL/uL (ref 3.87–5.11)
WBC: 5.9 10*3/uL (ref 4.0–10.5)

## 2013-02-10 NOTE — MAU Provider Note (Signed)
History     CSN: [id number]  Arrival date and time: 02/10/13 1216   First Provider Initiated Contact with Patient 02/10/13 1257      Chief Complaint  Patient presents with  . Vaginal Bleeding   HPI  KENA LIMON is a 22 y.o. G3P1011 who presents today with vaginal bleeding at [redacted]w[redacted]d. She states that last Monday she had heavy bleeding that stopped and then started again yesterday. She denies any pain, and states that the bleeding is only spotting in nature at this time. It is not enough to require a pad. She has had an ultrasound that showed a viable IUP with a subchorionic hemorrhage.  She states that the last time she had intercourse was about 2 weeks ago.   Past Medical History  Diagnosis Date  . Chlamydia     Past Surgical History  Procedure Laterality Date  . Induced abortion      Family History  Problem Relation Age of Onset  . Hypertension Mother     History  Substance Use Topics  . Smoking status: Current Every Day Smoker -- 0.25 packs/day    Types: Cigarettes  . Smokeless tobacco: Never Used  . Alcohol Use: No     Comment: Occas., but not recent    Allergies: No Known Allergies  Prescriptions prior to admission  Medication Sig Dispense Refill  . Prenatal Vit-Fe Fumarate-FA (MULTIVITAMIN-PRENATAL) 27-0.8 MG TABS Take 1 tablet by mouth daily at 12 noon.        Review of Systems  Constitutional: Negative for fever.  Eyes: Negative for blurred vision.  Respiratory: Negative for shortness of breath.   Cardiovascular: Negative for chest pain.  Gastrointestinal: Negative for nausea, vomiting, abdominal pain, diarrhea and constipation.  Genitourinary: Negative for dysuria, urgency and frequency.  Musculoskeletal: Negative for myalgias.  Neurological: Negative for dizziness and headaches.   Physical Exam   Blood pressure 109/55, pulse 119, temperature 98.4 F (36.9 C), temperature source Oral, resp. rate 18, height 5\' 3"  (1.6 m), weight 53.638 kg (118  lb 4 oz), last menstrual period 10/29/2011, SpO2 100.00%.  Physical Exam  Nursing note and vitals reviewed. Constitutional: She is oriented to person, place, and time. She appears well-developed and well-nourished. No distress.  Cardiovascular: Normal rate.   Respiratory: Effort normal.  GI: Soft. There is no tenderness.  Neurological: She is alert and oriented to person, place, and time.  Skin: Skin is warm and dry.  Psychiatric: She has a normal mood and affect.    MAU Course  Procedures  Results for orders placed during the hospital encounter of 02/10/13 (from the past 24 hour(s))  CBC     Status: Abnormal   Collection Time    02/10/13 12:47 PM      Result Value Range   WBC 5.9  4.0 - 10.5 K/uL   RBC 4.08  3.87 - 5.11 MIL/uL   Hemoglobin 12.0  12.0 - 15.0 g/dL   HCT 14.7 (*) 82.9 - 56.2 %   MCV 87.5  78.0 - 100.0 fL   MCH 29.4  26.0 - 34.0 pg   MCHC 33.6  30.0 - 36.0 g/dL   RDW 13.0  86.5 - 78.4 %   Platelets 267  150 - 400 K/uL  HCG, QUANTITATIVE, PREGNANCY     Status: Abnormal   Collection Time    02/10/13 12:47 PM      Result Value Range   hCG, Beta Chain, [place] Longest [id number] (*) <5 mIU/mL  Assessment and Plan   1. Subchorionic hematoma, antepartum, first trimester, fetus 1    Bleeding precautions reviewed [facility] as soon as possible   Tawnya Crook 02/10/2013, 12:58 PM

## 2013-02-10 NOTE — MAU Note (Signed)
Pt states was seen 02/06/2013 for bleeding, told she had [facility], bleeding stopped. Last pm bleeding began again. Was red, light, now darker brown and very light.

## 2013-02-10 NOTE — MAU Provider Note (Signed)
Attestation of Attending Supervision of Advanced Practitioner (CNM/NP): Evaluation and management procedures were performed by the Advanced Practitioner under my supervision and collaboration. I have reviewed the Advanced Practitioner's note and chart, and I agree with the management and plan.  Aydia Maj H. 4:17 PM   

## 2013-03-10 ENCOUNTER — Encounter (HOSPITAL_COMMUNITY): Payer: Self-pay | Admitting: Physician Assistant

## 2013-03-12 LAB — OB RESULTS CONSOLE HIV ANTIBODY (ROUTINE TESTING): HIV: NONREACTIVE

## 2013-03-12 LAB — OB RESULTS CONSOLE RPR: RPR: NONREACTIVE

## 2013-03-13 LAB — OB RESULTS CONSOLE GC/CHLAMYDIA
Chlamydia: NEGATIVE
Gonorrhea: NEGATIVE

## 2013-03-13 LAB — OB RESULTS CONSOLE ANTIBODY SCREEN: Antibody Screen: NEGATIVE

## 2013-03-14 ENCOUNTER — Other Ambulatory Visit (HOSPITAL_COMMUNITY): Payer: Self-pay | Admitting: Physician Assistant

## 2013-03-14 DIAGNOSIS — Z3682 Encounter for antenatal screening for nuchal translucency: Secondary | ICD-10-CM

## 2013-03-18 ENCOUNTER — Ambulatory Visit (HOSPITAL_COMMUNITY): Payer: Medicaid Other

## 2013-03-21 ENCOUNTER — Ambulatory Visit (HOSPITAL_COMMUNITY)
Admission: RE | Admit: 2013-03-21 | Discharge: 2013-03-21 | Disposition: A | Discharge status: Home / Self Care | Payer: Medicaid Other | Source: Ambulatory Visit | Attending: Physician Assistant | Admitting: Physician Assistant

## 2013-03-21 ENCOUNTER — Encounter (HOSPITAL_COMMUNITY): Payer: Self-pay

## 2013-03-21 DIAGNOSIS — Z3682 Encounter for antenatal screening for nuchal translucency: Secondary | ICD-10-CM

## 2013-03-21 DIAGNOSIS — O351XX0 Maternal care for (suspected) chromosomal abnormality in fetus, not applicable or unspecified: Secondary | ICD-10-CM | POA: Insufficient documentation

## 2013-03-21 DIAGNOSIS — Z3689 Encounter for other specified antenatal screening: Secondary | ICD-10-CM | POA: Insufficient documentation

## 2013-03-21 DIAGNOSIS — O3510X0 Maternal care for (suspected) chromosomal abnormality in fetus, unspecified, not applicable or unspecified: Secondary | ICD-10-CM | POA: Insufficient documentation

## 2013-03-21 NOTE — Progress Notes (Signed)
Tiffany Villegas  was seen today for an ultrasound appointment.  See full report in [facility].  Impression: Single IUP at 13 1/7 weeks Normal NT (2.1 mm)  Nasal bone visualized First trimester aneuploidy screen performed as noted above.   Recommendations: Please do not draw triple/quad screen, though patient should be offered MSAFP for neural tube defect screening.  Recommend ultrasound for fetal anatomy at approximately [redacted] weeks gestation.  Alpha Gula, MD

## 2013-04-10 ENCOUNTER — Other Ambulatory Visit (HOSPITAL_COMMUNITY): Payer: Self-pay | Admitting: Physician Assistant

## 2013-04-10 DIAGNOSIS — Z3689 Encounter for other specified antenatal screening: Secondary | ICD-10-CM

## 2013-04-18 ENCOUNTER — Other Ambulatory Visit: Payer: Self-pay

## 2013-04-21 ENCOUNTER — Ambulatory Visit (HOSPITAL_COMMUNITY)
Admission: RE | Admit: 2013-04-21 | Discharge: 2013-04-21 | Disposition: A | Discharge status: Home / Self Care | Payer: Medicaid Other | Source: Ambulatory Visit | Attending: Physician Assistant | Admitting: Physician Assistant

## 2013-04-21 DIAGNOSIS — O358XX0 Maternal care for other (suspected) fetal abnormality and damage, not applicable or unspecified: Secondary | ICD-10-CM | POA: Insufficient documentation

## 2013-04-21 DIAGNOSIS — Z1389 Encounter for screening for other disorder: Secondary | ICD-10-CM | POA: Insufficient documentation

## 2013-04-21 DIAGNOSIS — Z3689 Encounter for other specified antenatal screening: Secondary | ICD-10-CM

## 2013-04-21 DIAGNOSIS — Z363 Encounter for antenatal screening for malformations: Secondary | ICD-10-CM | POA: Insufficient documentation

## 2013-05-14 ENCOUNTER — Encounter (HOSPITAL_COMMUNITY): Payer: Self-pay

## 2013-05-14 ENCOUNTER — Inpatient Hospital Stay (HOSPITAL_COMMUNITY)
Admission: AD | Admit: 2013-05-14 | Discharge: 2013-05-15 | Disposition: A | Discharge status: Home / Self Care | Payer: Medicaid Other | Source: Ambulatory Visit | Attending: Obstetrics and Gynecology | Admitting: Obstetrics and Gynecology

## 2013-05-14 DIAGNOSIS — R109 Unspecified abdominal pain: Secondary | ICD-10-CM | POA: Insufficient documentation

## 2013-05-14 DIAGNOSIS — O99891 Other specified diseases and conditions complicating pregnancy: Secondary | ICD-10-CM | POA: Insufficient documentation

## 2013-05-14 DIAGNOSIS — A059 Bacterial foodborne intoxication, unspecified: Secondary | ICD-10-CM

## 2013-05-14 DIAGNOSIS — O210 Mild hyperemesis gravidarum: Secondary | ICD-10-CM | POA: Insufficient documentation

## 2013-05-14 DIAGNOSIS — K5289 Other specified noninfective gastroenteritis and colitis: Secondary | ICD-10-CM | POA: Insufficient documentation

## 2013-05-14 DIAGNOSIS — R197 Diarrhea, unspecified: Secondary | ICD-10-CM | POA: Insufficient documentation

## 2013-05-14 LAB — URINALYSIS, ROUTINE W REFLEX MICROSCOPIC
Bilirubin Urine: NEGATIVE
Ketones, ur: 15 mg/dL — AB
Nitrite: NEGATIVE
Urobilinogen, UA: 1 mg/dL (ref 0.0–1.0)

## 2013-05-14 LAB — URINE MICROSCOPIC-ADD ON

## 2013-05-14 MED ORDER — ONDANSETRON 8 MG PO TBDP
8.0000 mg | ORAL_TABLET | Freq: Once | ORAL | Status: AC
  Administered 2013-05-14: 8 mg via ORAL
  Filled 2013-05-14: qty 1

## 2013-05-14 NOTE — MAU Note (Signed)
Pt G3 P1 at 20.6wks with a sudden onset of abdominal pain, vomiting x 1 and one episode of diarrhea.  Pt feels like she may be leaking fluid.

## 2013-05-15 DIAGNOSIS — A059 Bacterial foodborne intoxication, unspecified: Secondary | ICD-10-CM

## 2013-05-15 MED ORDER — ONDANSETRON HCL 4 MG PO TABS: 4.0000 mg | ORAL_TABLET | Freq: Four times a day (QID) | ORAL | Status: DC

## 2013-05-15 MED ORDER — SODIUM CHLORIDE 0.9 % IV SOLN
Freq: Once | INTRAVENOUS | Status: AC
  Administered 2013-05-15: via INTRAVENOUS

## 2013-05-15 NOTE — MAU Provider Note (Signed)
History     CSN: [id number]  Arrival date and time: 05/14/13 2229   First Provider Initiated Contact with Patient 05/15/13 0006      Chief Complaint  Patient presents with  . Abdominal Cramping  . Emesis   Abdominal Cramping Associated symptoms include diarrhea, nausea and vomiting. Pertinent negatives include no constipation, dysuria, fever, frequency, headaches or hematuria.  Emesis  Associated symptoms include abdominal pain and diarrhea. Pertinent negatives include no chest pain, chills, fever or headaches.   Tiffany Villegas is a 22 y.o. G3P1011 at [redacted]w[redacted]d presents for evaluation of vomiting and diarrhea. Patient states that she mealtime any restaurant. Patient 2 hours after eating L. had acute stomach cramping and vomiting. This is followed by additional cramping and diarrhea. Patient states that she's got up twice and is persistently nauseated since onset. Patient is also had 4-5 bouts of diarrhea and reports that she had a gush of fluid those bouts. Patient unsure what orifice this came from.   Patient endorses persistent fetal movement, no vaginal bleeding and no persistent loss of fluid. Patient unable to distinguish cramping versus contractions.  Prior to entering usual state of health. Patient denies fevers, chills, chest pain, shortness of breath, urinary symptoms, weakness or headaches.  OB History   Grav Para Term Preterm Abortions TAB SAB Ect Mult Living   3 1 1  0 1 1 0 0 0 1      Past Medical History  Diagnosis Date  . Chlamydia     Past Surgical History  Procedure Laterality Date  . Induced abortion      Family History  Problem Relation Age of Onset  . Hypertension Mother     History  Substance Use Topics  . Smoking status: Former Smoker -- 0.25 packs/day    Types: Cigarettes    Quit date: 05/07/2013  . Smokeless tobacco: Never Used  . Alcohol Use: No     Comment: Occas., but not recent    Allergies: No Known Allergies  Prescriptions prior  to admission  Medication Sig Dispense Refill  . Prenatal Vit-Fe Fumarate-FA (PRENATAL MULTIVITAMIN) TABS Take 1 tablet by mouth daily at 12 noon.        Review of Systems  Constitutional: Negative for fever and chills.  Respiratory: Negative for shortness of breath.   Cardiovascular: Negative for chest pain.  Gastrointestinal: Positive for nausea, vomiting, abdominal pain and diarrhea. Negative for constipation and blood in stool.  Genitourinary: Negative for dysuria, urgency, frequency, hematuria and flank pain.  Neurological: Negative for weakness and headaches.   Physical Exam   Blood pressure 115/68, pulse 104, temperature 98 F (36.7 C), temperature source Oral, resp. rate 18, height 5\' 3"  (1.6 m), weight 56.065 kg (123 lb 9.6 oz), last menstrual period 12/20/2012.  Physical Exam  Nursing note and vitals reviewed. Constitutional: She appears well-developed and well-nourished. No distress.  HENT:  Head: Normocephalic and atraumatic.  Eyes: Conjunctivae are normal. Pupils are equal, round, and reactive to light.  Cardiovascular: Normal rate, regular rhythm and normal heart sounds.   GI: Soft. Bowel sounds are normal.  Skin: She is not diaphoretic.    MAU Course  Procedures  MDM Patient history most consistent acute gastroenteritis likely secondary to foodborne pathogens. Patient will be in Zofran and monitored. Patient ketones in urine and will give normal saline bolus and reevaluate in one hour.  Assessment and Plan  Pt feeling improved after fluids. Likely acute gastroenteritis vs food poisoning. Will give Rx for 10 of  zofran in case persistent. PTL precautions reviewed. Discharged home .  Tawana Scale 05/15/2013, 12:19 AM

## 2013-05-16 LAB — URINE CULTURE: Colony Count: 50000

## 2013-05-18 NOTE — MAU Provider Note (Signed)
Attestation of Attending Supervision of Advanced Practitioner: Evaluation and management procedures were performed by the PA/NP/CNM/OB Fellow under my supervision/collaboration. Chart reviewed and agree with management and plan.  Makela Niehoff V 05/18/2013 10:49 AM   

## 2013-07-24 ENCOUNTER — Other Ambulatory Visit (HOSPITAL_COMMUNITY): Payer: Self-pay | Admitting: Nurse Practitioner

## 2013-07-24 DIAGNOSIS — O365931 Maternal care for other known or suspected poor fetal growth, third trimester, fetus 1: Secondary | ICD-10-CM

## 2013-07-28 ENCOUNTER — Ambulatory Visit (HOSPITAL_COMMUNITY): Admission: RE | Admit: 2013-07-28 | Payer: Medicaid Other | Source: Ambulatory Visit

## 2013-07-28 ENCOUNTER — Ambulatory Visit (HOSPITAL_COMMUNITY)
Admission: RE | Admit: 2013-07-28 | Discharge: 2013-07-28 | Disposition: A | Discharge status: Home / Self Care | Payer: Medicaid Other | Source: Ambulatory Visit | Attending: Nurse Practitioner | Admitting: Nurse Practitioner

## 2013-07-28 DIAGNOSIS — O365990 Maternal care for other known or suspected poor fetal growth, unspecified trimester, not applicable or unspecified: Secondary | ICD-10-CM | POA: Insufficient documentation

## 2013-07-28 DIAGNOSIS — O99330 Smoking (tobacco) complicating pregnancy, unspecified trimester: Secondary | ICD-10-CM | POA: Insufficient documentation

## 2013-07-28 DIAGNOSIS — O365931 Maternal care for other known or suspected poor fetal growth, third trimester, fetus 1: Secondary | ICD-10-CM

## 2013-08-12 ENCOUNTER — Encounter (HOSPITAL_COMMUNITY): Payer: Self-pay | Admitting: *Deleted

## 2013-08-12 ENCOUNTER — Inpatient Hospital Stay (HOSPITAL_COMMUNITY)
Admission: AD | Admit: 2013-08-12 | Discharge: 2013-08-12 | Disposition: A | Discharge status: Home / Self Care | Payer: Medicaid Other | Source: Ambulatory Visit | Attending: Obstetrics & Gynecology | Admitting: Obstetrics & Gynecology

## 2013-08-12 DIAGNOSIS — O26899 Other specified pregnancy related conditions, unspecified trimester: Secondary | ICD-10-CM

## 2013-08-12 DIAGNOSIS — M549 Dorsalgia, unspecified: Secondary | ICD-10-CM | POA: Insufficient documentation

## 2013-08-12 DIAGNOSIS — O99891 Other specified diseases and conditions complicating pregnancy: Secondary | ICD-10-CM | POA: Insufficient documentation

## 2013-08-12 DIAGNOSIS — O99330 Smoking (tobacco) complicating pregnancy, unspecified trimester: Secondary | ICD-10-CM | POA: Insufficient documentation

## 2013-08-12 DIAGNOSIS — N949 Unspecified condition associated with female genital organs and menstrual cycle: Secondary | ICD-10-CM | POA: Insufficient documentation

## 2013-08-12 DIAGNOSIS — R109 Unspecified abdominal pain: Secondary | ICD-10-CM | POA: Insufficient documentation

## 2013-08-12 LAB — URINE MICROSCOPIC-ADD ON

## 2013-08-12 LAB — URINALYSIS, ROUTINE W REFLEX MICROSCOPIC
Glucose, UA: NEGATIVE mg/dL
Hgb urine dipstick: NEGATIVE
Protein, ur: NEGATIVE mg/dL

## 2013-08-12 MED ORDER — CYCLOBENZAPRINE HCL 10 MG PO TABS
10.0000 mg | ORAL_TABLET | Freq: Once | ORAL | Status: AC
  Administered 2013-08-12: 10 mg via ORAL
  Filled 2013-08-12: qty 1

## 2013-08-12 NOTE — Progress Notes (Signed)
Reminded to drink 8-10glasses flds daily.

## 2013-08-12 NOTE — MAU Provider Note (Signed)
History     CSN: [id number]  Arrival date and time: 08/12/13 1203   None     Chief Complaint  Patient presents with  . Abdominal Pain   HPI  Pt is a 22 yo G3P1011 at [redacted]w[redacted]d weeks IUP here with report of lower pelvic pain and back pain that started yesterday.  Pain increases with walking.  No vaginal bleeding or leaking of fluid.  [facility]    Past Medical History  Diagnosis Date  . Chlamydia 2014    Past Surgical History  Procedure Laterality Date  . Induced abortion      Family History  Problem Relation Age of Onset  . Hypertension Mother     History  Substance Use Topics  . Smoking status: Current Some Day Smoker -- 0.25 packs/day    Types: Cigarettes    Last Attempt to Quit: 05/07/2013  . Smokeless tobacco: Never Used  . Alcohol Use: No     Comment: Occas., but not recent    Allergies: No Known Allergies  Prescriptions prior to admission  Medication Sig Dispense Refill  . calcium carbonate (TUMS - DOSED IN MG ELEMENTAL CALCIUM) 500 MG chewable tablet Chew 2 tablets by mouth daily.      . Prenatal Vit-Fe Fumarate-FA (PRENATAL MULTIVITAMIN) TABS Take 1 tablet by mouth daily at 12 noon.      . ondansetron (ZOFRAN) 4 MG tablet Take 1 tablet (4 mg total) by mouth every 6 (six) hours.  12 tablet  0    Review of Systems  Gastrointestinal: Positive for abdominal pain (pelvic).  Genitourinary: Negative for dysuria and urgency.  Musculoskeletal: Positive for back pain.  All other systems reviewed and are negative.   Physical Exam   Blood pressure 110/65, pulse 115, temperature 98.5 F (36.9 C), temperature source Oral, resp. rate 18, height 5\' 3"  (1.6 m), weight 65.318 kg (144 lb), last menstrual period 12/20/2012.  Physical Exam  Constitutional: She is oriented to person, place, and time. She appears well-developed and well-nourished.  HENT:  Head: Normocephalic.  Neck: Normal range of motion. Neck supple.   Cardiovascular: Normal rate, regular rhythm and normal heart sounds.   Respiratory: Effort normal and breath sounds normal.  GI: Soft. There is no tenderness.  Genitourinary: No bleeding around the vagina. Vaginal discharge (mucusy) found.  Musculoskeletal: Normal range of motion. She exhibits no edema.  Neurological: She is alert and oriented to person, place, and time.  Skin: Skin is warm and dry.   Dilation: 1 Effacement (%): 30 Cervical Position: Middle Station: -3 Presentation: Vertex Exam by:: Roney Marion, CNM  Cervix rechecked > no change  1420 Consulted with Dr. Debroah Loop > reviewed HPI/OB Hx/exam > keep scheduled follow-up/no BMZ since 48 hrs from 34 wks  FHR 140's, +accels, reactive Toco - x 2 MAU Course  Procedures  Results for orders placed during the hospital encounter of 08/12/13 (from the past 24 hour(s))  URINALYSIS, ROUTINE W REFLEX MICROSCOPIC     Status: Abnormal   Collection Time    08/12/13 12:25 PM      Result Value Range   Color, Urine YELLOW  YELLOW   APPearance CLEAR  CLEAR   Specific Gravity, Urine 1.020  1.005 - 1.030   pH 7.5  5.0 - 8.0   Glucose, UA NEGATIVE  NEGATIVE mg/dL   Hgb urine dipstick NEGATIVE  NEGATIVE   Bilirubin Urine NEGATIVE  NEGATIVE   Ketones, ur NEGATIVE  NEGATIVE mg/dL  Protein, ur NEGATIVE  NEGATIVE mg/dL   Urobilinogen, UA 0.2  0.0 - 1.0 mg/dL   Nitrite NEGATIVE  NEGATIVE   Leukocytes, UA SMALL (*) NEGATIVE  URINE MICROSCOPIC-ADD ON     Status: Abnormal   Collection Time    08/12/13 12:25 PM      Result Value Range   Squamous Epithelial / LPF MANY (*) RARE   WBC, UA 3-6  <3 WBC/hpf   RBC / HPF 0-2  <3 RBC/hpf   Bacteria, UA FEW (*) RARE    Assessment and Plan  22 yo G3P1011 at [redacted]w[redacted]d wks IUP Pelvic Pain Category I FHR Tracing   Plan: Discharge to home Preterm labor precautions Advised belly band Keep scheduled appointment [facility] 08/12/2013, 12:51 PM

## 2013-08-12 NOTE — MAU Note (Signed)
Since yesterday started having really bad lower abd pain, pelvic pain and low back.  Even when she walks.

## 2013-08-12 NOTE — Progress Notes (Signed)
Threasa Heads CNM in and discussed d/c plan. Written and verbal d/c instructions given and understanding voiced

## 2013-08-14 ENCOUNTER — Other Ambulatory Visit (HOSPITAL_COMMUNITY): Payer: Self-pay | Admitting: Family

## 2013-08-14 DIAGNOSIS — Z3689 Encounter for other specified antenatal screening: Secondary | ICD-10-CM

## 2013-08-14 LAB — URINE CULTURE

## 2013-08-14 NOTE — MAU Provider Note (Signed)
Attestation of Attending Supervision of Advanced Practitioner (CNM/NP): Evaluation and management procedures were performed by the Advanced Practitioner under my supervision and collaboration.  I have reviewed the Advanced Practitioner's note and chart, and I agree with the management and plan.  HARRAWAY-SMITH, Bruin Bolger 2:42 PM     

## 2013-09-03 LAB — OB RESULTS CONSOLE GBS: GBS: NEGATIVE

## 2013-09-08 ENCOUNTER — Ambulatory Visit (HOSPITAL_COMMUNITY)
Admission: RE | Admit: 2013-09-08 | Discharge: 2013-09-08 | Disposition: A | Discharge status: Home / Self Care | Payer: Medicaid Other | Source: Ambulatory Visit | Attending: Family | Admitting: Family

## 2013-09-08 ENCOUNTER — Other Ambulatory Visit (HOSPITAL_COMMUNITY): Payer: Self-pay | Admitting: Family

## 2013-09-08 ENCOUNTER — Inpatient Hospital Stay (HOSPITAL_COMMUNITY)
Admission: AD | Admit: 2013-09-08 | Discharge: 2013-09-11 | DRG: 775 | Disposition: A | Discharge status: Home / Self Care | Payer: Medicaid Other | Source: Ambulatory Visit | Attending: Obstetrics & Gynecology | Admitting: Obstetrics & Gynecology

## 2013-09-08 ENCOUNTER — Encounter (HOSPITAL_COMMUNITY): Payer: Self-pay | Admitting: *Deleted

## 2013-09-08 DIAGNOSIS — Z3689 Encounter for other specified antenatal screening: Secondary | ICD-10-CM

## 2013-09-08 DIAGNOSIS — O365990 Maternal care for other known or suspected poor fetal growth, unspecified trimester, not applicable or unspecified: Principal | ICD-10-CM | POA: Diagnosis present

## 2013-09-08 DIAGNOSIS — O99334 Smoking (tobacco) complicating childbirth: Secondary | ICD-10-CM | POA: Diagnosis present

## 2013-09-08 DIAGNOSIS — IMO0002 Reserved for concepts with insufficient information to code with codable children: Secondary | ICD-10-CM | POA: Diagnosis present

## 2013-09-08 DIAGNOSIS — O99330 Smoking (tobacco) complicating pregnancy, unspecified trimester: Secondary | ICD-10-CM | POA: Insufficient documentation

## 2013-09-08 LAB — CBC
HCT: 32.1 % — ABNORMAL LOW (ref 36.0–46.0)
Hemoglobin: 10.9 g/dL — ABNORMAL LOW (ref 12.0–15.0)
RBC: 3.71 MIL/uL — ABNORMAL LOW (ref 3.87–5.11)

## 2013-09-08 MED ORDER — TERBUTALINE SULFATE 1 MG/ML IJ SOLN: 0.2500 mg | Freq: Once | INTRAMUSCULAR | Status: AC | PRN

## 2013-09-08 MED ORDER — FENTANYL CITRATE 0.05 MG/ML IJ SOLN
100.0000 ug | INTRAMUSCULAR | Status: DC | PRN
  Administered 2013-09-08 – 2013-09-09 (×4): 100 ug via INTRAVENOUS
  Filled 2013-09-08 (×4): qty 2

## 2013-09-08 MED ORDER — LACTATED RINGERS IV SOLN: 500.0000 mL | Freq: Once | INTRAVENOUS | Status: DC

## 2013-09-08 MED ORDER — OXYCODONE-ACETAMINOPHEN 5-325 MG PO TABS: 1.0000 | ORAL_TABLET | ORAL | Status: DC | PRN

## 2013-09-08 MED ORDER — LACTATED RINGERS IV SOLN
500.0000 mL | INTRAVENOUS | Status: DC | PRN
  Administered 2013-09-09: 500 mL via INTRAVENOUS

## 2013-09-08 MED ORDER — EPHEDRINE 5 MG/ML INJ
10.0000 mg | INTRAVENOUS | Status: DC | PRN
  Filled 2013-09-08: qty 4
  Filled 2013-09-08: qty 2

## 2013-09-08 MED ORDER — ONDANSETRON HCL 4 MG/2ML IJ SOLN: 4.0000 mg | Freq: Four times a day (QID) | INTRAMUSCULAR | Status: DC | PRN

## 2013-09-08 MED ORDER — CITRIC ACID-SODIUM CITRATE 334-500 MG/5ML PO SOLN: 30.0000 mL | ORAL | Status: DC | PRN

## 2013-09-08 MED ORDER — ACETAMINOPHEN 325 MG PO TABS: 650.0000 mg | ORAL_TABLET | ORAL | Status: DC | PRN

## 2013-09-08 MED ORDER — OXYTOCIN 40 UNITS IN LACTATED RINGERS INFUSION - SIMPLE MED
1.0000 m[IU]/min | INTRAVENOUS | Status: DC
  Administered 2013-09-08: 8 m[IU]/min via INTRAVENOUS
  Administered 2013-09-08: 2 m[IU]/min via INTRAVENOUS
  Administered 2013-09-09: 10 m[IU]/min via INTRAVENOUS
  Filled 2013-09-08: qty 1000

## 2013-09-08 MED ORDER — OXYTOCIN BOLUS FROM INFUSION: 500.0000 mL | INTRAVENOUS | Status: DC

## 2013-09-08 MED ORDER — OXYTOCIN 40 UNITS IN LACTATED RINGERS INFUSION - SIMPLE MED: 62.5000 mL/h | INTRAVENOUS | Status: DC

## 2013-09-08 MED ORDER — FENTANYL 2.5 MCG/ML BUPIVACAINE 1/10 % EPIDURAL INFUSION (WH - ANES)
14.0000 mL/h | INTRAMUSCULAR | Status: DC | PRN
  Filled 2013-09-08: qty 125

## 2013-09-08 MED ORDER — PHENYLEPHRINE 40 MCG/ML (10ML) SYRINGE FOR IV PUSH (FOR BLOOD PRESSURE SUPPORT)
80.0000 ug | PREFILLED_SYRINGE | INTRAVENOUS | Status: DC | PRN
  Filled 2013-09-08: qty 2
  Filled 2013-09-08: qty 10

## 2013-09-08 MED ORDER — LACTATED RINGERS IV SOLN
INTRAVENOUS | Status: DC
  Administered 2013-09-08: 125 mL/h via INTRAVENOUS
  Administered 2013-09-09: 01:00:00 via INTRAVENOUS
  Administered 2013-09-09: 125 mL/h via INTRAVENOUS

## 2013-09-08 MED ORDER — IBUPROFEN 600 MG PO TABS
600.0000 mg | ORAL_TABLET | Freq: Four times a day (QID) | ORAL | Status: DC | PRN
  Administered 2013-09-09: 600 mg via ORAL
  Filled 2013-09-08: qty 1

## 2013-09-08 MED ORDER — LIDOCAINE HCL (PF) 1 % IJ SOLN
30.0000 mL | INTRAMUSCULAR | Status: DC | PRN
  Filled 2013-09-08: qty 30

## 2013-09-08 MED ORDER — DIPHENHYDRAMINE HCL 50 MG/ML IJ SOLN
12.5000 mg | INTRAMUSCULAR | Status: DC | PRN
  Administered 2013-09-09: 12.5 mg via INTRAVENOUS
  Filled 2013-09-08: qty 1

## 2013-09-08 MED ORDER — EPHEDRINE 5 MG/ML INJ
10.0000 mg | INTRAVENOUS | Status: DC | PRN
  Filled 2013-09-08: qty 2

## 2013-09-08 MED ORDER — PHENYLEPHRINE 40 MCG/ML (10ML) SYRINGE FOR IV PUSH (FOR BLOOD PRESSURE SUPPORT)
80.0000 ug | PREFILLED_SYRINGE | INTRAVENOUS | Status: DC | PRN
  Filled 2013-09-08: qty 2

## 2013-09-08 NOTE — Progress Notes (Signed)
Tiffany Villegas is a 22 y.o. G3P1011 at [redacted]w[redacted]d by 6w U/S admitted for induction of labor due to Poor fetal growth.  Subjective: Pt anxious but without acute complaints.   Objective: BP 117/68  Pulse 90  Temp(Src) 98.1 F (36.7 C) (Oral)  Resp 18  Ht 5\' 3"  (1.6 m)  Wt 66.679 kg (147 lb)  BMI 26.05 kg/m2  LMP 12/20/2012      FHT:  FHR: 140 bpm, variability: moderate,  accelerations:  Present,  decelerations:  Absent UC:   irregular, every 2-5 minutes SVE:   Dilation: 2.5 Effacement (%): 60 Station: -2 Exam by:: Dr. Jarvis Villegas  Labs: Lab Results  Component Value Date   WBC 8.0 09/08/2013   HGB 10.9* 09/08/2013   HCT 32.1* 09/08/2013   MCV 86.5 09/08/2013   PLT 219 09/08/2013    Assessment / Plan: Tiffany Villegas is a 22 y.o. G3P1011 at [redacted]w[redacted]d here for IOL for poor fetal growth (EFW 11%ile).   Labor: Foley bulb still in place, pitocin at 9mU/min Preeclampsia:  N/A Fetal Wellbeing:  Category I Pain Control:  Fentanyl and planning on epidural I/D:  GBS neg Anticipated MOD:  NSVD  Tiffany Villegas 09/08/2013, 7:06 PM

## 2013-09-08 NOTE — H&P (Signed)
Tiffany Villegas is a 22 y.o. female presenting for IOL for IUGR.  She has had size < dates and fetus displayed symmetric IUGR on U/S today per Dr. Charlann Lange. Pt is without complaints. +GFM. No LOF, VB, UC's.    Receives [facility] at health department. Pregnancy significant for cigarette smoking up until about 1 month ago. Denies EtOH, illicit drugs. On review of EMR, she had a subchorionic hemorrhage in the 1st trimester but no bleeding since.   History OB History   Grav Para Term Preterm Abortions TAB SAB Ect Mult Living   3 1 1  0 1 1 0 0 0 1    G1 (2009): NSVD after IOL at [redacted]w[redacted]d. 6lbs 11oz. Female G2: TAB G3: Current. EFW: 5lbs 6oz. (11%ile)  Past Medical History  Diagnosis Date  . Chlamydia 2014   Past Surgical History  Procedure Laterality Date  . Induced abortion     Family History: family history includes Hypertension in her mother. Social History:  reports that she has been smoking Cigarettes.  She has been smoking about 0.25 packs per day. She has never used smokeless tobacco. She reports that she does not drink alcohol or use illicit drugs.  ROS As above, otherwise ROS negative   Last menstrual period 12/20/2012. Maternal Exam:  Introitus: Vulva is negative for lesion.  Vagina is negative for discharge.    Physical Exam  Constitutional: She is oriented to person, place, and time. She appears well-developed and well-nourished.  HENT:  Head: Normocephalic.  Mouth/Throat: Oropharynx is clear and moist.  Eyes: EOM are normal. Pupils are equal, round, and reactive to light.  Neck: Normal range of motion. Neck supple.  Cardiovascular: Normal rate, regular rhythm and normal heart sounds.   Respiratory: Effort normal and breath sounds normal. No respiratory distress.  GI: Soft. There is no tenderness.  Genitourinary: Vulva exhibits no lesion. No vaginal discharge found.  Musculoskeletal: Normal range of motion.  Neurological: She is alert and oriented to person, place, and  time.  Skin: Skin is warm.    SVE: 2/60/soft. VTX  FHT:  Baseline 150, moderate variability, 15 x 15 accelerations present, no decelerations Toco:  No contractions  Prenatal labs: ABO, Rh: --/--/AB POS (05/13 0865) Antibody:  Neg Rubella:  Immune RPR:   Neg HBsAg:   Neg HIV:   Non-reactive GBS:   Neg  Assessment/Plan: Tiffany Villegas is a 22 y.o. G3P1011 at [redacted]w[redacted]d by 6w U/S here for IOL for IUGR  #Labor: Bishops score of 7, Place foley bulb for further cervical ripening, start low-dose pitocin.  #Pain: Fentanyl, Epidural upon request #FWB: Category I, BPP 8/8 today #ID:  GBS neg #MOF: Breast feeding #MOC: Depo #Circ:  Declines   Anticipate NSVD  [name] 09/08/2013, 3:34 PM  I have seen and examined this patient and agree with above documentation in the resident's note.   Rulon Abide, M.D. [facility] Fellow 09/08/2013 5:05 PM

## 2013-09-09 ENCOUNTER — Inpatient Hospital Stay (HOSPITAL_COMMUNITY): Payer: Medicaid Other | Admitting: Anesthesiology

## 2013-09-09 ENCOUNTER — Encounter (HOSPITAL_COMMUNITY): Payer: Self-pay | Admitting: *Deleted

## 2013-09-09 ENCOUNTER — Encounter (HOSPITAL_COMMUNITY): Payer: Medicaid Other | Admitting: Anesthesiology

## 2013-09-09 DIAGNOSIS — O99334 Smoking (tobacco) complicating childbirth: Secondary | ICD-10-CM

## 2013-09-09 DIAGNOSIS — O365990 Maternal care for other known or suspected poor fetal growth, unspecified trimester, not applicable or unspecified: Secondary | ICD-10-CM

## 2013-09-09 LAB — RPR: RPR Ser Ql: NONREACTIVE

## 2013-09-09 MED ORDER — LANOLIN HYDROUS EX OINT: TOPICAL_OINTMENT | CUTANEOUS | Status: DC | PRN

## 2013-09-09 MED ORDER — IBUPROFEN 600 MG PO TABS
600.0000 mg | ORAL_TABLET | Freq: Four times a day (QID) | ORAL | Status: DC
  Administered 2013-09-09 – 2013-09-11 (×7): 600 mg via ORAL
  Filled 2013-09-09 (×7): qty 1

## 2013-09-09 MED ORDER — PRENATAL MULTIVITAMIN CH
1.0000 | ORAL_TABLET | Freq: Every day | ORAL | Status: DC
  Administered 2013-09-10 – 2013-09-11 (×2): 1 via ORAL
  Filled 2013-09-09 (×2): qty 1

## 2013-09-09 MED ORDER — ONDANSETRON HCL 4 MG/2ML IJ SOLN: 4.0000 mg | INTRAMUSCULAR | Status: DC | PRN

## 2013-09-09 MED ORDER — ZOLPIDEM TARTRATE 5 MG PO TABS: 5.0000 mg | ORAL_TABLET | Freq: Every evening | ORAL | Status: DC | PRN

## 2013-09-09 MED ORDER — ONDANSETRON HCL 4 MG PO TABS: 4.0000 mg | ORAL_TABLET | ORAL | Status: DC | PRN

## 2013-09-09 MED ORDER — LIDOCAINE HCL (PF) 1 % IJ SOLN
INTRAMUSCULAR | Status: DC | PRN
  Administered 2013-09-09 (×2): 4 mL

## 2013-09-09 MED ORDER — DIPHENHYDRAMINE HCL 25 MG PO CAPS: 25.0000 mg | ORAL_CAPSULE | Freq: Four times a day (QID) | ORAL | Status: DC | PRN

## 2013-09-09 MED ORDER — OXYCODONE-ACETAMINOPHEN 5-325 MG PO TABS
1.0000 | ORAL_TABLET | ORAL | Status: DC | PRN
  Administered 2013-09-10: 1 via ORAL
  Administered 2013-09-10: 2 via ORAL
  Administered 2013-09-10: 1 via ORAL
  Administered 2013-09-10: 2 via ORAL
  Filled 2013-09-09: qty 1
  Filled 2013-09-09 (×2): qty 2
  Filled 2013-09-09: qty 1

## 2013-09-09 MED ORDER — DIBUCAINE 1 % RE OINT: 1.0000 "application " | TOPICAL_OINTMENT | RECTAL | Status: DC | PRN

## 2013-09-09 MED ORDER — SIMETHICONE 80 MG PO CHEW: 80.0000 mg | CHEWABLE_TABLET | ORAL | Status: DC | PRN

## 2013-09-09 MED ORDER — WITCH HAZEL-GLYCERIN EX PADS: 1.0000 "application " | MEDICATED_PAD | CUTANEOUS | Status: DC | PRN

## 2013-09-09 MED ORDER — TETANUS-DIPHTH-ACELL PERTUSSIS 5-2.5-18.5 LF-MCG/0.5 IM SUSP: 0.5000 mL | Freq: Once | INTRAMUSCULAR | Status: DC

## 2013-09-09 MED ORDER — FENTANYL 2.5 MCG/ML BUPIVACAINE 1/10 % EPIDURAL INFUSION (WH - ANES)
INTRAMUSCULAR | Status: DC | PRN
  Administered 2013-09-09: 14 mL/h via EPIDURAL

## 2013-09-09 MED ORDER — SENNOSIDES-DOCUSATE SODIUM 8.6-50 MG PO TABS
2.0000 | ORAL_TABLET | ORAL | Status: DC
  Administered 2013-09-09 – 2013-09-11 (×2): 2 via ORAL
  Filled 2013-09-09 (×2): qty 2

## 2013-09-09 MED ORDER — BENZOCAINE-MENTHOL 20-0.5 % EX AERO
1.0000 "application " | INHALATION_SPRAY | CUTANEOUS | Status: DC | PRN
  Administered 2013-09-09: 1 via TOPICAL
  Filled 2013-09-09: qty 56

## 2013-09-09 NOTE — Progress Notes (Signed)
Patient ID: Tiffany Villegas, female   DOB: 05-Sep-1991, 22 y.o.   MRN: [id number] Tiffany Villegas is a 22 y.o. G3P1011 at [redacted]w[redacted]d admitted for IOL indicated by FGR Subjective: Comfortable with epidural  Objective: BP 117/61  Pulse 84  Temp(Src) 97.9 F (36.6 C) (Oral)  Resp 20  Ht 5\' 3"  (1.6 m)  Wt 66.679 kg (147 lb)  BMI 26.05 kg/m2  SpO2 98%  LMP 12/20/2012  Fetal Heart FHR: 140 bpm, variability: moderate,  accelerations:  Present,  decelerations:  Absent   Contractions: q 4-6 with coupling; pitocin at 24 mu  SVE:   Dilation: 6.5 Effacement (%): 70 Station: -1 Exam by:: D. Poe, CNM cx unchanged 6/80// -2, LOA AROM> large amount clear AF  Assessment / Plan:  Labor: Active with slow progress Fetal Wellbeing: cataegory 1 Pain Control:  adequate Expected mode of delivery: NSVD  POE,DEIRDRE 09/09/2013, 4:18 PM

## 2013-09-09 NOTE — Progress Notes (Signed)
LASONYA HUBNER is a 22 y.o. G3P1011 at [redacted]w[redacted]d admitted for IOL for IUGR.  Subjective: Pt comfortably resting s/p epidural.   Objective: BP 115/73  Pulse 89  Temp(Src) 97.3 F (36.3 C) (Oral)  Resp 20  Ht 5\' 3"  (1.6 m)  Wt 66.679 kg (147 lb)  BMI 26.05 kg/m2  SpO2 98%  LMP 12/20/2012   Total I/O In: -  Out: 1400 [Urine:1400]  FHT:  FHR: 140 bpm, variability: moderate,  accelerations:  Present,  decelerations:  Absent UC:   regular, every 2.5 minutes SVE:   Dilation: 6.5 Effacement (%): 70 Station: -2 Exam by:: Valentina Lucks, RN  Labs: Lab Results  Component Value Date   WBC 8.0 09/08/2013   HGB 10.9* 09/08/2013   HCT 32.1* 09/08/2013   MCV 86.5 09/08/2013   PLT 219 09/08/2013    Assessment / Plan: BLEU MOISAN is a 22 y.o. G3P1011 at [redacted]w[redacted]d here for IOL for poor fetal growth (EFW 11%ile).   Labor: Active labor, pitocin at 48mU/min  Preeclampsia: N/A  Fetal Wellbeing: Category I  Pain Control: Epidural  I/D: GBS neg  Anticipated MOD: NSVD  [name] 09/09/2013, 2:29 PM

## 2013-09-09 NOTE — Anesthesia Preprocedure Evaluation (Addendum)
Anesthesia Evaluation  Patient identified by MRN, date of birth, ID band Patient awake    Reviewed: Allergy & Precautions, H&P , Patient's Chart, lab work & pertinent test results  Airway Mallampati: II TM Distance: >3 FB Neck ROM: Full    Dental no notable dental hx. (+) Teeth Intact   Pulmonary Current Smoker,    Pulmonary exam normal       Cardiovascular negative cardio ROS  Rhythm:Regular Rate:Normal     Neuro/Psych PSYCHIATRIC DISORDERS Depression negative neurological ROS     GI/Hepatic Neg liver ROS, GERD-  Medicated and Controlled,  Endo/Other  negative endocrine ROS  Renal/GU negative Renal ROS  negative genitourinary   Musculoskeletal negative musculoskeletal ROS (+)   Abdominal   Peds  Hematology negative hematology ROS (+)   Anesthesia Other Findings   Reproductive/Obstetrics (+) Pregnancy 37 4/7 weeks IUGR                          Anesthesia Physical Anesthesia Plan  ASA: II  Anesthesia Plan: Epidural   Post-op Pain Management:    Induction:   Airway Management Planned: Natural Airway  Additional Equipment:   Intra-op Plan:   Post-operative Plan:   Informed Consent: I have reviewed the patients History and Physical, chart, labs and discussed the procedure including the risks, benefits and alternatives for the proposed anesthesia with the patient or authorized representative who has indicated his/her understanding and acceptance.     Plan Discussed with: Anesthesiologist  Anesthesia Plan Comments:         Anesthesia Quick Evaluation

## 2013-09-09 NOTE — Progress Notes (Signed)
Tiffany Villegas is a 22 y.o. G3P1011 at [redacted]w[redacted]d admitted for induction of labor due to IUGR.  Subjective:  Pt starting to hurt more than earlier. +FM. No LOF, VB  Objective: BP 109/66  Pulse 87  Temp(Src) 98 F (36.7 C) (Oral)  Resp 18  Ht 5\' 3"  (1.6 m)  Wt 66.679 kg (147 lb)  BMI 26.05 kg/m2  LMP 12/20/2012      FHT:  FHR: 135 bpm, variability: moderate,  accelerations:  Present,  decelerations:  Absent UC:   irregular, every 1-4 minutes SVE:   Dilation: 4 Effacement (%): 50 Station: -2 Exam by:: Dr. Reola Calkins  Labs: Lab Results  Component Value Date   WBC 8.0 09/08/2013   HGB 10.9* 09/08/2013   HCT 32.1* 09/08/2013   MCV 86.5 09/08/2013   PLT 219 09/08/2013    Assessment / Plan: IOL for IUGR  Labor: in early labor. s/p fb. now increasing pitocin. irregular contraction pattern still Fetal Wellbeing:  Category I Pain Control:  Fentanyl I/D:  n/a Anticipated MOD:  NSVD  Tiffany Villegas L 09/09/2013, 1:17 AM

## 2013-09-09 NOTE — Progress Notes (Signed)
Tiffany Villegas is a 22 y.o. G3P1011 at [redacted]w[redacted]d admitted for IOL for IUGR.   Subjective:  Pt now s/p pit rest and back on pitocin at 64mu/min.  Doing well. Feeling some pain with contractions. +FM  Objective: BP 109/47  Pulse 97  Temp(Src) 97.4 F (36.3 C) (Oral)  Resp 20  Ht 5\' 3"  (1.6 m)  Wt 66.679 kg (147 lb)  BMI 26.05 kg/m2  LMP 12/20/2012      FHT:  FHR: 135 bpm, variability: moderate,  accelerations:  Present,  decelerations:  Absent UC:   regular, every 4 minutes  SVE:   Dilation: 5 Effacement (%): 60 Station: -2 Exam by:: LCarpenter,RN  Labs: Lab Results  Component Value Date   WBC 8.0 09/08/2013   HGB 10.9* 09/08/2013   HCT 32.1* 09/08/2013   MCV 86.5 09/08/2013   PLT 219 09/08/2013    Assessment / Plan: IOL for IUGR.  in early labor  Labor: cont pitocin- currently at 66mu/min. cont to increase.  Fetal Wellbeing:  Category I Pain Control:  Fentanyl I/D:  n/a Anticipated MOD:  NSVD  Tiffany Villegas L 09/09/2013, 8:46 AM

## 2013-09-09 NOTE — Anesthesia Procedure Notes (Signed)
Epidural Patient location during procedure: OB Start time: 09/09/2013 9:27 AM  Staffing Anesthesiologist: Koraline Phillipson A.  Preanesthetic Checklist Completed: patient identified, site marked, surgical consent, pre-op evaluation, timeout performed, IV checked, risks and benefits discussed and monitors and equipment checked  Epidural Patient position: sitting Prep: site prepped and draped and DuraPrep Patient monitoring: continuous pulse ox and blood pressure Approach: midline Injection technique: LOR air  Needle:  Needle type: Tuohy  Needle gauge: 17 G Needle length: 9 cm and 9 Needle insertion depth: 5 cm cm Catheter type: closed end flexible Catheter size: 19 Gauge Catheter at skin depth: 10 cm Test dose: negative and Other  Assessment Events: blood not aspirated, injection not painful, no injection resistance, negative IV test and no paresthesia  Additional Notes Patient identified. Risks and benefits discussed including failed block, incomplete  Pain control, post dural puncture headache, nerve damage, paralysis, blood pressure Changes, nausea, vomiting, reactions to medications-both toxic and allergic and post Partum back pain. All questions were answered. Patient expressed understanding and wished to proceed. Sterile technique was used throughout procedure. Epidural site was Dressed with sterile barrier dressing. No paresthesias, signs of intravascular injection Or signs of intrathecal spread were encountered.  Patient was more comfortable after the epidural was dosed. Please see RN's note for documentation of vital signs and FHR which are stable.

## 2013-09-10 NOTE — Clinical Social Work Maternal (Signed)
    Clinical Social Work Department PSYCHOSOCIAL ASSESSMENT - MATERNAL/CHILD 09/10/2013  Patient:  Tiffany Villegas, Tiffany Villegas  Account Number:  [id number]  Admit Date:  09/08/2013  Marjo Bicker Name:   Billie Lade    Clinical Social Worker:  Nobie Putnam, LCSW   Date/Time:  09/10/2013 11:35 AM  Date Referred:  09/10/2013   Referral source  CN     Referred reason  Depression/Anxiety   Other referral source:    I:  FAMILY / HOME ENVIRONMENT Child's legal guardian:  PARENT  Guardian - Name Guardian - Age Guardian - Address  [place] [address]; [place], [place] [address]  Clelia Croft 40    Other household support members/support persons Name Relationship DOB   SON 42 years old   RELATIVE Cousin   RELATIVE Cousin's daughter   Other support:   Family    II  PSYCHOSOCIAL DATA Information Source:  Patient Interview  Event organiser Employment:   Surveyor, quantity resources:  [employer] If Medicaid - County:  [place] Other  Sales executive  WIC   School / Grade:   Maternity Care Coordinator / Child Services Coordination / Early Interventions:  Cultural issues impacting care:    III  STRENGTHS Strengths  Adequate Resources  Home prepared for Child (including basic supplies)  Supportive family/friends   Strength comment:    IV  RISK FACTORS AND CURRENT PROBLEMS Current Problem:  YES   Risk Factor & Current Problem Patient Issue Family Issue Risk Factor / Current Problem Comment  Mental Illness Y N Hx of depression & SI    V  SOCIAL WORK ASSESSMENT CSW referral received to assess pt's history of depression & SI in 2008.  Pt acknowledges some depressed feelings during childhood & most recently, after terminating a pregnancy last year.  Her depression symptoms were treated with medication in the past.  She has not taken any medication since then & states she has coped well.  She told CSW that she gets emotional & sad about her decision, which she thinks is  normal.  Even though pt admits that she has periods of depression, she is able to function appropriately daily.  SI noted in 2008.  Pt did not elaborate on details but denies any SI since that time.  CSW discussed PP depression signs/symptoms & encouraged pt to seek medical attention if needed.  She is not interested in counseling referrals at this time.  She denies substance use.  Pt has all the necessary supplies for the infant & good family support.  She appears to be bonding well with the infant & appropriate at this time.  CSW available to assist further if needed.        VI SOCIAL WORK PLAN Social Work Plan  No Further Intervention Required / No Barriers to Discharge   Type of pt/family education:   If child protective services report - county:   If child protective services report - date:   Information/referral to community resources comment:   Other social work plan:

## 2013-09-10 NOTE — Progress Notes (Signed)
Post Partum Day #1 Subjective: Pain well controlled, bleeding equal to normal menses, eating, drinking, voiding, + flatus.   Objective: Blood pressure 117/78, pulse 93, temperature 97.8 F (36.6 C), temperature source Oral, resp. rate 18, height 5\' 3"  (1.6 m), weight 66.679 kg (147 lb), last menstrual period 12/20/2012, SpO2 98.00%, unknown if currently breastfeeding.  Physical Exam:  General: alert, cooperative and no distress Lochia: appropriate Uterine Fundus: firm Incision: n/a DVT Evaluation: No evidence of DVT seen on physical exam. Negative Homan's sign. No cords or calf tenderness.   Recent Labs  09/08/13 1555  HGB 10.9*  HCT 32.1*    Assessment/Plan: Tiffany Villegas is a 22 y.o. G3 now P2012 PPD#1 s/p NSVD at [redacted]w[redacted]d, doing well.   Plan for discharge tomorrow, Breastfeeding and Contraception Depo   LOS: 2 days   [name] 09/10/2013, 7:54 AM

## 2013-09-10 NOTE — Anesthesia Postprocedure Evaluation (Signed)
Anesthesia Post Note  Patient: Tiffany Villegas  Procedure(s) Performed: * No procedures listed *  Anesthesia type: Epidural  Patient location: Mother/Baby  Post pain: Pain level controlled  Post assessment: Post-op Vital signs reviewed  Last Vitals: BP 117/78  Pulse 93  Temp(Src) 36.6 C (Oral)  Resp 18  Ht 5\' 3"  (1.6 m)  Wt 147 lb (66.679 kg)  BMI 26.05 kg/m2  SpO2 98%  LMP 12/20/2012  Post vital signs: Reviewed  Level of consciousness: awake  Complications: No apparent anesthesia complications

## 2013-09-10 NOTE — Lactation Note (Addendum)
This note was copied from the chart of Tiffany Villegas. Lactation Consultation Note  Patient Name: Tiffany Villegas [id number] Date: 09/10/2013 Reason for consult: Initial assessment;Infant < 6lbs Mom reports baby is latching well, denies tenderness with baby at the breast. BF basics reviewed. Encouraged to continue to que base BF, cluster feeding discussed. Advised Mom baby should be at the breast 8-12 times in 24 hours. Lactation brochure left for review. Advised of OP services and support group. Advised to call for assist as needed. Encouraged to call for latch check.   Maternal Data Formula Feeding for Exclusion: No Infant to breast within first hour of birth: Yes Has patient been taught Hand Expression?: Yes Does the patient have breastfeeding experience prior to this delivery?: Yes  Feeding Feeding Type: Breast Fed Length of feed: 10 min  LATCH Score/Interventions                Intervention(s): Breastfeeding basics reviewed     Lactation Tools Discussed/Used WIC Program: Yes   Consult Status Consult Status: Follow-up Date: 09/11/13 Follow-up type: In-patient    Alfred Levins 09/10/2013, 2:43 PM

## 2013-09-10 NOTE — Progress Notes (Signed)
UR chart review completed.  

## 2013-09-10 NOTE — Progress Notes (Signed)
I was present for the exam and agree with above.  [place], CNM 09/10/2013 9:10 AM

## 2013-09-11 MED ORDER — IBUPROFEN 600 MG PO TABS: 600.0000 mg | ORAL_TABLET | Freq: Four times a day (QID) | ORAL | Status: DC

## 2013-09-11 NOTE — Discharge Summary (Signed)
`````  Attestation of Attending Supervision of Advanced Practitioner: Evaluation and management procedures were performed by the PA/NP/CNM/OB Fellow under my supervision/collaboration. Chart reviewed and agree with management and plan.  Torez Beauregard V 09/11/2013 10:28 PM

## 2013-09-11 NOTE — Discharge Summary (Signed)
Obstetric Discharge Summary Reason for Admission: induction of labor for IUGR Prenatal Procedures: NST and ultrasound Intrapartum Procedures: spontaneous vaginal delivery Postpartum Procedures: none Complications-Operative and Postpartum: hemostatic small vaginal  laceration without indicated repair Hemoglobin  Date Value Range Status  09/08/2013 10.9* 12.0 - 15.0 g/dL Final     HCT  Date Value Range Status  09/08/2013 32.1* 36.0 - 46.0 % Final   Brief Hospital Course: Tiffany Villegas is a 22 y.o. G3 now P2012 on PPD#2 s/p NSVD at [redacted]w[redacted]d. She was admitted afterher baby was found to have IUGR on ultrasound. Induction was successful and she has had an uneventful postpartum course. She will continue breast feeding, desires depo for contraception, and will not have her baby boy circumcised. She [facility] 6 weeks.   Physical Exam:  General: alert, cooperative and no distress Lochia: appropriate Uterine Fundus: firm Incision: n/a DVT Evaluation: No evidence of DVT seen on physical exam. Negative Homan's sign. No cords or calf tenderness. No significant calf/ankle edema.  Discharge Diagnoses: Term Pregnancy-delivered  Discharge Information: Date: 09/11/2013 Activity: pelvic rest Diet: routine Medications: PNV and Ibuprofen Condition: stable Instructions: refer to practice specific booklet Discharge to: home Follow-up Information   Follow up with [facility] In 6 weeks. (call to schedule postpartum appointment at health department)    Contact information:   [address] [place] [address] [phone]      Newborn Data: Live born female  Birth Weight: 5 lb 14 oz (2665 g) APGAR: 9, 9  Home with mother.  Tiffany Villegas 09/11/2013, 7:40 AM  I have seen and examined this patient and I agree with the above. Cam Hai 8:20 AM 09/11/2013

## 2014-01-13 ENCOUNTER — Inpatient Hospital Stay (HOSPITAL_COMMUNITY)
Admission: AD | Admit: 2014-01-13 | Discharge: 2014-01-13 | Disposition: A | Discharge status: Home / Self Care | Payer: Medicaid Other | Source: Ambulatory Visit | Attending: Obstetrics & Gynecology | Admitting: Obstetrics & Gynecology

## 2014-01-13 ENCOUNTER — Encounter (HOSPITAL_COMMUNITY): Payer: Self-pay | Admitting: *Deleted

## 2014-01-13 DIAGNOSIS — R112 Nausea with vomiting, unspecified: Secondary | ICD-10-CM | POA: Insufficient documentation

## 2014-01-13 DIAGNOSIS — M545 Low back pain, unspecified: Secondary | ICD-10-CM | POA: Insufficient documentation

## 2014-01-13 DIAGNOSIS — F17200 Nicotine dependence, unspecified, uncomplicated: Secondary | ICD-10-CM | POA: Insufficient documentation

## 2014-01-13 DIAGNOSIS — Z3202 Encounter for pregnancy test, result negative: Secondary | ICD-10-CM | POA: Insufficient documentation

## 2014-01-13 LAB — URINALYSIS, ROUTINE W REFLEX MICROSCOPIC
Bilirubin Urine: NEGATIVE
GLUCOSE, UA: NEGATIVE mg/dL
KETONES UR: NEGATIVE mg/dL
LEUKOCYTES UA: NEGATIVE
NITRITE: NEGATIVE
Protein, ur: NEGATIVE mg/dL
Specific Gravity, Urine: 1.025 (ref 1.005–1.030)
Urobilinogen, UA: 0.2 mg/dL (ref 0.0–1.0)
pH: 6 (ref 5.0–8.0)

## 2014-01-13 LAB — URINE MICROSCOPIC-ADD ON

## 2014-01-13 LAB — POCT PREGNANCY, URINE: PREG TEST UR: NEGATIVE

## 2014-01-13 NOTE — MAU Note (Signed)
Patient states she has had nausea and vomiting for about 3 days. States she is having mild back pain. Denies bleeding or vaginal discharge.

## 2014-01-13 NOTE — MAU Provider Note (Signed)
History     CSN: [id number]  Arrival date and time: 01/13/14 1142   First Provider Initiated Contact with Patient 01/13/14 1300      Chief Complaint  Patient presents with  . Possible Pregnancy  . Emesis  . Back Pain   HPI  [name] is 23 y.o. [id number] presenting for nausea and vomiting x 2 days that she was concerned for signs of pregnancy. Associated symptoms include low back pain. She did not take a HPT and came in to the MAU to be evaluated for possible pregnancy. Her last regular LMP was 15 March.  She denies vaginal bleeding and discharge, abdominal/pelvic pain. ROS of systems are as below. Of note, patient states that she is not intending to become pregnant.  OB History   Grav Para Term Preterm Abortions TAB SAB Ect Mult Living   3 2 2  0 1 1 0 0 0 2      Past Medical History  Diagnosis Date  . Chlamydia 2014    Past Surgical History  Procedure Laterality Date  . Induced abortion      Family History  Problem Relation Age of Onset  . Hypertension Mother     History  Substance Use Topics  . Smoking status: Current Some Day Smoker -- 0.25 packs/day    Types: Cigarettes    Last Attempt to Quit: 05/07/2013  . Smokeless tobacco: Never Used  . Alcohol Use: Yes     Comment: occasional    Allergies: No Known Allergies  Prescriptions prior to admission  Medication Sig Dispense Refill  . calcium carbonate (TUMS - DOSED IN MG ELEMENTAL CALCIUM) 500 MG chewable tablet Chew 2 tablets by mouth daily.      Marland Kitchen. ibuprofen (ADVIL,MOTRIN) 600 MG tablet Take 1 tablet (600 mg total) by mouth every 6 (six) hours.  30 tablet  0  . Prenatal Vit-Fe Fumarate-FA (PRENATAL MULTIVITAMIN) TABS Take 1 tablet by mouth daily at 12 noon.        Review of Systems  Constitutional: Negative for fever and chills.  HENT: Negative for hearing loss and sore throat.   Eyes: Negative for blurred vision.  Respiratory: Negative for cough and shortness of breath.   Cardiovascular: Negative  for chest pain and leg swelling.  Gastrointestinal: Positive for nausea (x2 days) and vomiting (x2 days). Negative for abdominal pain, diarrhea, constipation and blood in stool.  Genitourinary: Negative for dysuria, frequency, hematuria and flank pain.  Musculoskeletal: Negative for joint pain.  Skin: Negative for rash.  Neurological: Negative for dizziness and headaches.   Physical Exam   Blood pressure 127/86, pulse 110, temperature 98.7 F (37.1 C), temperature source Oral, resp. rate 16, height 5' 1.5" (1.562 m), weight 54.069 kg (119 lb 3.2 oz), last menstrual period 12/07/2013, SpO2 99.00%, unknown if currently breastfeeding.  Physical Exam  Constitutional: She is oriented to person, place, and time. She appears well-developed and well-nourished. No distress.  HENT:  Head: Normocephalic and atraumatic.  Neck: Normal range of motion. No tracheal deviation present.  Cardiovascular: Normal rate, regular rhythm, normal heart sounds and intact distal pulses.  Exam reveals no gallop and no friction rub.   No murmur heard. Respiratory: Effort normal and breath sounds normal. No stridor. No respiratory distress. She has no wheezes.  GI: Soft. Bowel sounds are normal. She exhibits no distension. There is no tenderness.  Musculoskeletal: Normal range of motion. She exhibits no edema and no tenderness.  Neurological: She is alert and oriented to person, place,  and time.  Skin: Skin is warm and dry. She is not diaphoretic.  Psychiatric: She has a normal mood and affect.    MAU Course  Procedures  Results for orders placed during the hospital encounter of 01/13/14 (from the past 24 hour(s))  URINALYSIS, ROUTINE W REFLEX MICROSCOPIC     Status: Abnormal   Collection Time    01/13/14 12:00 PM      Result Value Ref Range   Color, Urine YELLOW  YELLOW   APPearance CLEAR  CLEAR   Specific Gravity, Urine 1.025  1.005 - 1.030   pH 6.0  5.0 - 8.0   Glucose, UA NEGATIVE  NEGATIVE mg/dL   Hgb  urine dipstick TRACE (*) NEGATIVE   Bilirubin Urine NEGATIVE  NEGATIVE   Ketones, ur NEGATIVE  NEGATIVE mg/dL   Protein, ur NEGATIVE  NEGATIVE mg/dL   Urobilinogen, UA 0.2  0.0 - 1.0 mg/dL   Nitrite NEGATIVE  NEGATIVE   Leukocytes, UA NEGATIVE  NEGATIVE  URINE MICROSCOPIC-ADD ON     Status: Abnormal   Collection Time    01/13/14 12:00 PM      Result Value Ref Range   Squamous Epithelial / LPF FEW (*) RARE   WBC, UA 0-2  <3 WBC/hpf   RBC / HPF 0-2  <3 RBC/hpf  POCT PREGNANCY, URINE     Status: None   Collection Time    01/13/14 12:21 PM      Result Value Ref Range   Preg Test, Ur NEGATIVE  NEGATIVE   MDM Tiffany Villegas is 23 y.o. presenting with N/V x 2 days that she was worried were signs of pregnancy. UPT was negative in the MAU. UA is within normal limits except trace hgb which has been present in previous UA's. Today Urine micro was negative for rbc and wbc and she has not systemic signs of infection. Patient's N/V vomiting may be due to viral illness.  Assessment and Plan   A: Nausea and vomiting, NOT pregnant     Encounter for pregnancy test; results negative   P: D/C home - Patient advised to go to HD for contraception since she admits not intending pregnancy. - Advised to drink fluids for hydration and attempt BRAT diet for N/V.  Wallis BambergMario Mani 01/13/2014, 1:03 PM   Evaluation and management procedures were performed by the PA student under my supervision and collaboration. I have reviewed the note and chart, and I agree with the management and plan.  Iona HansenJennifer Irene Willet Schleifer, NP 01/13/2014 2:35 PM

## 2014-01-15 NOTE — MAU Provider Note (Signed)
Attestation of Attending Supervision of Advanced Practitioner (PA/CNM/NP): Evaluation and management procedures were performed by the Advanced Practitioner under my supervision and collaboration.  I have reviewed the Advanced Practitioner's note and chart, and I agree with the management and plan.  Kahealani Yankovich, MD, FACOG Attending Obstetrician & Gynecologist Faculty Practice, [facility]  

## 2014-03-12 ENCOUNTER — Encounter (HOSPITAL_COMMUNITY): Payer: Self-pay | Admitting: Emergency Medicine

## 2014-03-12 ENCOUNTER — Emergency Department (HOSPITAL_COMMUNITY)
Admission: EM | Admit: 2014-03-12 | Discharge: 2014-03-12 | Disposition: A | Discharge status: Home / Self Care | Payer: Medicaid Other | Attending: Emergency Medicine | Admitting: Emergency Medicine

## 2014-03-12 ENCOUNTER — Emergency Department (HOSPITAL_COMMUNITY): Payer: Medicaid Other

## 2014-03-12 DIAGNOSIS — Z79899 Other long term (current) drug therapy: Secondary | ICD-10-CM | POA: Insufficient documentation

## 2014-03-12 DIAGNOSIS — J029 Acute pharyngitis, unspecified: Secondary | ICD-10-CM | POA: Insufficient documentation

## 2014-03-12 DIAGNOSIS — R079 Chest pain, unspecified: Secondary | ICD-10-CM | POA: Insufficient documentation

## 2014-03-12 DIAGNOSIS — A749 Chlamydial infection, unspecified: Secondary | ICD-10-CM | POA: Insufficient documentation

## 2014-03-12 DIAGNOSIS — R0602 Shortness of breath: Secondary | ICD-10-CM | POA: Insufficient documentation

## 2014-03-12 DIAGNOSIS — F17200 Nicotine dependence, unspecified, uncomplicated: Secondary | ICD-10-CM | POA: Insufficient documentation

## 2014-03-12 LAB — I-STAT CHEM 8, ED
BUN: 9 mg/dL (ref 6–23)
CALCIUM ION: 1.21 mmol/L (ref 1.12–1.23)
CREATININE: 1 mg/dL (ref 0.50–1.10)
Chloride: 102 mEq/L (ref 96–112)
GLUCOSE: 87 mg/dL (ref 70–99)
HCT: 41 % (ref 36.0–46.0)
HEMOGLOBIN: 13.9 g/dL (ref 12.0–15.0)
Potassium: 3.7 mEq/L (ref 3.7–5.3)
Sodium: 142 mEq/L (ref 137–147)
TCO2: 25 mmol/L (ref 0–100)

## 2014-03-12 LAB — CBC WITH DIFFERENTIAL/PLATELET
Basophils Absolute: 0 10*3/uL (ref 0.0–0.1)
Basophils Relative: 0 % (ref 0–1)
EOS ABS: 0 10*3/uL (ref 0.0–0.7)
EOS PCT: 0 % (ref 0–5)
HCT: 37.3 % (ref 36.0–46.0)
Hemoglobin: 12.6 g/dL (ref 12.0–15.0)
LYMPHS PCT: 19 % (ref 12–46)
Lymphs Abs: 1.5 10*3/uL (ref 0.7–4.0)
MCH: 30.4 pg (ref 26.0–34.0)
MCHC: 33.8 g/dL (ref 30.0–36.0)
MCV: 89.9 fL (ref 78.0–100.0)
MONOS PCT: 8 % (ref 3–12)
Monocytes Absolute: 0.7 10*3/uL (ref 0.1–1.0)
Neutro Abs: 6 10*3/uL (ref 1.7–7.7)
Neutrophils Relative %: 73 % (ref 43–77)
Platelets: 244 10*3/uL (ref 150–400)
RBC: 4.15 MIL/uL (ref 3.87–5.11)
RDW: 12.6 % (ref 11.5–15.5)
WBC: 8.2 10*3/uL (ref 4.0–10.5)

## 2014-03-12 LAB — I-STAT TROPONIN, ED: Troponin i, poc: 0 ng/mL (ref 0.00–0.08)

## 2014-03-12 LAB — D-DIMER, QUANTITATIVE: D-Dimer, Quant: <0.27 ug/mL-FEU (ref 0.00–0.48)

## 2014-03-12 LAB — RAPID STREP SCREEN (MED CTR MEBANE ONLY): STREPTOCOCCUS, GROUP A SCREEN (DIRECT): NEGATIVE

## 2014-03-12 MED ORDER — RANITIDINE HCL 150 MG PO CAPS: 150.0000 mg | ORAL_CAPSULE | Freq: Every day | ORAL | Status: DC

## 2014-03-12 MED ORDER — GI COCKTAIL ~~LOC~~
30.0000 mL | Freq: Once | ORAL | Status: AC
  Administered 2014-03-12: 30 mL via ORAL
  Filled 2014-03-12: qty 30

## 2014-03-12 MED ORDER — OMEPRAZOLE 20 MG PO CPDR: 20.0000 mg | DELAYED_RELEASE_CAPSULE | Freq: Every day | ORAL | Status: DC

## 2014-03-12 NOTE — ED Provider Notes (Signed)
Medical screening examination/treatment/procedure(s) were performed by non-physician practitioner and as supervising physician I was immediately available for consultation/collaboration.   EKG Interpretation   Date/Time:  Thursday March 12 2014 10:55:32 EDT Ventricular Rate:  118 PR Interval:    QRS Duration: 78 QT Interval:  316 QTC Calculation: 442 R Axis:   69 Text Interpretation:  Sinus tachycardia Confirmed by WARD,  DO, KRISTEN  [phone]([id number]) on 03/12/2014 2:32:56 PM        Layla MawKristen N Ward, DO 03/12/14 1539

## 2014-03-12 NOTE — ED Notes (Signed)
Pt reports sore throat that started yesterday and having chest pain and sob. No resp distress noted. ekg done at triage.

## 2014-03-12 NOTE — ED Notes (Signed)
Pt returned from xray

## 2014-03-12 NOTE — ED Provider Notes (Signed)
CSN: [id number]     Arrival date & time 03/12/14  1049 History   First MD Initiated Contact with Patient 03/12/14 1059     Chief Complaint  Patient presents with  . Chest Pain  . Shortness of Breath     (Consider location/radiation/quality/duration/timing/severity/associated sxs/prior Treatment) HPI  23 year old female who presents complaining of chest pain and shortness of breath. Patient reports since yesterday she has been having persistent sore throat worsening with swallowing. She also endorsed pleuritic chest pain with shortness of breath that is new. Symptom is worsened when she stands, or taking a deep breath. She has never had the symptoms before. She denies fever, chills, runny nose sneezing or cough, hemoptysis, nausea vomiting or diarrhea, abdominal pain or back pain, dysuria or rash. Denies any significant family history of cardiac disease. Patient is occasional smoker. She did report one isolated incident of exertional syncope when she passed out from running as a child. No prior history of PE or DVT, no recent surgery, prolonged bed rest, unilateral leg swelling, history of cancer, or taking birth control pill. She thought symptom is likely due to a cold and tried to take Alka-Seltzer, drinking tea and honey with minimal relief.  Past Medical History  Diagnosis Date  . Chlamydia 2014   Past Surgical History  Procedure Laterality Date  . Induced abortion     Family History  Problem Relation Age of Onset  . Hypertension Mother    History  Substance Use Topics  . Smoking status: Current Some Day Smoker -- 0.25 packs/day    Types: Cigarettes    Last Attempt to Quit: 05/07/2013  . Smokeless tobacco: Never Used  . Alcohol Use: Yes     Comment: occasional   OB History   Grav Para Term Preterm Abortions TAB SAB Ect Mult Living   3 2 2  0 1 1 0 0 0 2     Review of Systems  All other systems reviewed and are negative.     Allergies  Review of patient's allergies  indicates no known allergies.  Home Medications   Prior to Admission medications   Medication Sig Start Date End Date Taking? Authorizing Provider  calcium carbonate (TUMS - DOSED IN MG ELEMENTAL CALCIUM) 500 MG chewable tablet Chew 2 tablets by mouth daily.    Historical Provider, MD  ibuprofen (ADVIL,MOTRIN) 600 MG tablet Take 1 tablet (600 mg total) by mouth every 6 (six) hours. 09/11/13   Hazeline Junkeryan Grunz, MD  Prenatal Vit-Fe Fumarate-FA (PRENATAL MULTIVITAMIN) TABS Take 1 tablet by mouth daily at 12 noon.    Historical Provider, MD   BP 118/74  Pulse 120  Temp(Src) 98.2 F (36.8 C) (Oral)  Resp 18  SpO2 99% Physical Exam  Nursing note and vitals reviewed. Constitutional: She is oriented to person, place, and time. She appears well-developed and well-nourished. No distress.  HENT:  Head: Atraumatic.  Right Ear: External ear normal.  Left Ear: External ear normal.  Nose: Nose normal.  Mouth/Throat: Oropharynx is clear and moist.  throat exam, patient with midline uvula, no tonsillar enlargement or exudates, no trismus, no evidence of deep tissue infection.  Eyes: Conjunctivae are normal.  Neck: Neck supple.  Cardiovascular:  Tachycardia oh without murmurs rubs or gallop  Pulmonary/Chest: Effort normal and breath sounds normal. No respiratory distress. She has no wheezes. She has no rales. She exhibits no tenderness.  Abdominal: Soft. There is no tenderness.  Musculoskeletal: She exhibits no edema.  Bilateral lower extremities without palpable cords,  erythema, edema, negative Homans sign  Neurological: She is alert and oriented to person, place, and time.  Skin: No rash noted.  Psychiatric: She has a normal mood and affect.    ED Course  Procedures (including critical care time)  11:30 AM Patient is here complaining of pleuritic chest pain and shortness of breath and is tachycardic but without tachypneic. Her lung exam is unremarkable. She is satting at 99% on room and she is  afebrile. She doesn't have any significant risk factors for PE but giving the tachycardia and the pleuritic component, a d-dimer was ordered. She also complaining of sore throat. On exam patient has an unremarkable throat exam, and is able to swallow without difficulty. Rapid strep test obtained. Suspect GERD.  12:30 PM Patient's labs are reassuring. Normal EKG, normal troponin, normal d-dimer, and strep test negative. Chest x-ray is unremarkable. GI cocktail given for her sore throat.  2:23 PM Pt report some improvement with GI cocktail.  Will have pt f/u with PCP for further management.  Doubt acute emergent condition.  No resp distress.  Stable for discharge with PPI and H2 blocker.  Return precaution discussed.     Labs Review Labs Reviewed  RAPID STREP SCREEN  CULTURE, GROUP A STREP  CBC WITH DIFFERENTIAL  D-DIMER, QUANTITATIVE  I-STAT TROPOININ, ED  I-STAT CHEM 8, ED    Imaging Review Dg Chest 2 View  03/12/2014   CLINICAL DATA:  Chest pain.  EXAM: CHEST  2 VIEW  COMPARISON:  No prior.  FINDINGS: Mediastinum and hilar structures normal. Lungs are clear. Heart size normal. No pleural effusion or pneumothorax. No acute bony abnormality. Thoracic spine scoliosis.  IMPRESSION: No acute abnormality.   Electronically Signed   By: Maisie Fushomas  Register   On: 03/12/2014 12:15     EKG Interpretation None      Date: 03/12/2014  Rate: 118  Rhythm: sinus tachycardia  QRS Axis: normal  Intervals: normal  ST/T Wave abnormalities: normal  Conduction Disutrbances:none  Narrative Interpretation:   Old EKG Reviewed: none available    MDM   Final diagnoses:  Chest pain    BP 110/73  Pulse 100  Temp(Src) 98.2 F (36.8 C) (Oral)  Resp 24  SpO2 100%  I have reviewed nursing notes and vital signs. I personally reviewed the imaging tests through PACS system  I reviewed available ER/hospitalization records thought the EMR    Fayrene HelperBowie Tran, PA-C 03/12/14 1427

## 2014-03-12 NOTE — Discharge Instructions (Signed)
Please follow up with your doctor for further management of your pain.  Take prilosec and xantac as these may help with your discomfort.  Return if your symptoms worsen or if you have other concerns.    Chest Pain (Nonspecific) It is often hard to give a diagnosis for the cause of chest pain. There is always a chance that your pain could be related to something serious, such as a heart attack or a blood clot in the lungs. You need to follow up with your doctor. HOME CARE  If antibiotic medicine was given, take it as directed by your doctor. Finish the medicine even if you start to feel better.  For the next few days, avoid activities that bring on chest pain. Continue physical activities as told by your doctor.  Do not use any tobacco products. This includes cigarettes, chewing tobacco, and e-cigarettes.  Avoid drinking alcohol.  Only take medicine as told by your doctor.  Follow your doctor's suggestions for more testing if your chest pain does not go away.  Keep all doctor visits you made. GET HELP IF:  Your chest pain does not go away, even after treatment.  You have a rash with blisters on your chest.  You have a fever. GET HELP RIGHT AWAY IF:   You have more pain or pain that spreads to your arm, neck, jaw, back, or belly (abdomen).  You have shortness of breath.  You cough more than usual or cough up blood.  You have very bad back or belly pain.  You feel sick to your stomach (nauseous) or throw up (vomit).  You have very bad weakness.  You pass out (faint).  You have chills. This is an emergency. Do not wait to see if the problems will go away. Call your local emergency services (911 in [place] Do not drive yourself to the hospital. MAKE SURE YOU:   Understand these instructions.  Will watch your condition.  Will get help right away if you are not doing well or get worse. Document Released: 02/28/2008 Document Revised: 09/16/2013 Document Reviewed:  02/28/2008 [facility] Patient Information 2015 [employer], [place]. This information is not intended to replace advice given to you by your health care provider. Make sure you discuss any questions you have with your health care provider.

## 2014-03-14 LAB — CULTURE, GROUP A STREP

## 2014-07-27 ENCOUNTER — Encounter (HOSPITAL_COMMUNITY): Payer: Self-pay | Admitting: Emergency Medicine

## 2014-08-13 ENCOUNTER — Emergency Department (HOSPITAL_COMMUNITY)
Admission: EM | Admit: 2014-08-13 | Discharge: 2014-08-13 | Disposition: A | Discharge status: Home / Self Care | Payer: Medicaid Other | Attending: Emergency Medicine | Admitting: Emergency Medicine

## 2014-08-13 ENCOUNTER — Emergency Department (HOSPITAL_COMMUNITY): Payer: Medicaid Other

## 2014-08-13 ENCOUNTER — Encounter (HOSPITAL_COMMUNITY): Payer: Self-pay | Admitting: *Deleted

## 2014-08-13 DIAGNOSIS — S60921A Unspecified superficial injury of right hand, initial encounter: Secondary | ICD-10-CM | POA: Diagnosis present

## 2014-08-13 DIAGNOSIS — S61219A Laceration without foreign body of unspecified finger without damage to nail, initial encounter: Secondary | ICD-10-CM

## 2014-08-13 DIAGNOSIS — S61213A Laceration without foreign body of left middle finger without damage to nail, initial encounter: Secondary | ICD-10-CM | POA: Insufficient documentation

## 2014-08-13 DIAGNOSIS — T1490XA Injury, unspecified, initial encounter: Secondary | ICD-10-CM

## 2014-08-13 DIAGNOSIS — S60229A Contusion of unspecified hand, initial encounter: Secondary | ICD-10-CM

## 2014-08-13 DIAGNOSIS — Z23 Encounter for immunization: Secondary | ICD-10-CM | POA: Diagnosis not present

## 2014-08-13 DIAGNOSIS — Y998 Other external cause status: Secondary | ICD-10-CM | POA: Insufficient documentation

## 2014-08-13 DIAGNOSIS — S60221A Contusion of right hand, initial encounter: Secondary | ICD-10-CM | POA: Diagnosis not present

## 2014-08-13 DIAGNOSIS — Y939 Activity, unspecified: Secondary | ICD-10-CM | POA: Diagnosis not present

## 2014-08-13 DIAGNOSIS — Z79899 Other long term (current) drug therapy: Secondary | ICD-10-CM | POA: Diagnosis not present

## 2014-08-13 DIAGNOSIS — Z720 Tobacco use: Secondary | ICD-10-CM | POA: Diagnosis not present

## 2014-08-13 DIAGNOSIS — Y929 Unspecified place or not applicable: Secondary | ICD-10-CM | POA: Diagnosis not present

## 2014-08-13 DIAGNOSIS — W230XXA Caught, crushed, jammed, or pinched between moving objects, initial encounter: Secondary | ICD-10-CM | POA: Insufficient documentation

## 2014-08-13 MED ORDER — OXYCODONE-ACETAMINOPHEN 5-325 MG PO TABS
1.0000 | ORAL_TABLET | Freq: Once | ORAL | Status: AC
  Administered 2014-08-13: 1 via ORAL
  Filled 2014-08-13: qty 1

## 2014-08-13 MED ORDER — TETANUS-DIPHTH-ACELL PERTUSSIS 5-2.5-18.5 LF-MCG/0.5 IM SUSP
0.5000 mL | Freq: Once | INTRAMUSCULAR | Status: AC
  Administered 2014-08-13: 0.5 mL via INTRAMUSCULAR
  Filled 2014-08-13: qty 0.5

## 2014-08-13 MED ORDER — HYDROCODONE-ACETAMINOPHEN 5-325 MG PO TABS: ORAL_TABLET | ORAL | Status: DC

## 2014-08-13 NOTE — ED Provider Notes (Signed)
CSN: [id number]     Arrival date & time 08/13/14  1347 History  This chart was scribed for Tiffany EmeryNicole Ebba Goll, PA-C with Tilden FossaElizabeth Rees, MD by Tonye RoyaltyJoshua Chen, ED Scribe. This patient was seen in room TR05C/TR05C and the patient's care was started at 2:10 PM.    Chief Complaint  Patient presents with  . Hand Injury   HPI  HPI Comments: Tiffany CassisJasmine K Villegas is a 23 y.o. female who presents to the Emergency Department complaining of right hand injury after slamming it in a car door earlier today. She rates it as 10/10 and states the pain is worst in her middle finger. She states there was significant bleeding. She does not know when her last tetanus shot was.  Eyes numbness, paresthesia, active bleeding. Patient is right-hand-dominant.  Past Medical History  Diagnosis Date  . Chlamydia 2014   Past Surgical History  Procedure Laterality Date  . Induced abortion     Family History  Problem Relation Age of Onset  . Hypertension Mother    History  Substance Use Topics  . Smoking status: Current Every Day Smoker -- 0.25 packs/day    Types: Cigarettes  . Smokeless tobacco: Never Used  . Alcohol Use: Yes     Comment: occasional   OB History    Gravida Para Term Preterm AB TAB SAB Ectopic Multiple Living   3 2 2  0 1 1 0 0 0 2     Review of Systems  All other systems reviewed and are negative.  A complete 10 system review of systems was obtained and all systems are negative except as noted in the HPI and PMH.    Allergies  Review of patient's allergies indicates no known allergies.  Home Medications   Prior to Admission medications   Medication Sig Start Date End Date Taking? Authorizing Provider  ibuprofen (ADVIL,MOTRIN) 200 MG tablet Take 800 mg by mouth every 6 (six) hours as needed for mild pain or moderate pain.   Yes Historical Provider, MD  HYDROcodone-acetaminophen (NORCO/VICODIN) 5-325 MG per tablet Take 1-2 tablets by mouth every 6 hours as needed for pain. 08/13/14   Adeleigh Barletta, PA-C  omeprazole (PRILOSEC) 20 MG capsule Take 1 capsule (20 mg total) by mouth daily. Patient not taking: Reported on 08/13/2014 03/12/14   Fayrene HelperBowie Tran, PA-C  ranitidine (ZANTAC) 150 MG capsule Take 1 capsule (150 mg total) by mouth daily. Patient not taking: Reported on 08/13/2014 03/12/14   Fayrene HelperBowie Tran, PA-C   BP 119/83 mmHg  Pulse 107  Temp(Src) 98.4 F (36.9 C) (Oral)  Resp 16  Ht 5\' 3"  (1.6 m)  Wt 116 lb (52.617 kg)  BMI 20.55 kg/m2  SpO2 100%  LMP 07/13/2014 Physical Exam  Constitutional: She is oriented to person, place, and time. She appears well-developed and well-nourished. No distress.  HENT:  Head: Normocephalic.  Eyes: Conjunctivae and EOM are normal.  Cardiovascular: Normal rate.   Pulmonary/Chest: Effort normal. No stridor.  Musculoskeletal: Normal range of motion.       Hands: No significant swelling or deformity. Patient is most tender to palpation along the right third digit at the DIP.  Neurological: She is alert and oriented to person, place, and time.  Psychiatric: She has a normal mood and affect.  Nursing note and vitals reviewed.   ED Course  Procedures (including critical care time)  DIAGNOSTIC STUDIES: Oxygen Saturation is 98% on room air, normal by my interpretation.    COORDINATION OF CARE: 2:12 PM Discussed treatment  plan with patient at beside, including Dermabond to close her wound, x-ray, tetanus shot, and pain medication. The patient agrees with the plan and has no further questions at this time.  Labs Review Labs Reviewed - No data to display  Imaging Review Dg Hand Complete Right  08/13/2014   CLINICAL DATA:  Blunt trauma, slammed hand in door. Right middle finger pain  EXAM: RIGHT HAND - COMPLETE 3+ VIEW  COMPARISON:  None.  FINDINGS: No evidence of fracture of the carpal or metacarpal bones. Radiocarpal joint is intact. Phalanges are normal. No soft tissue injury.  IMPRESSION: No fracture or dislocation.   Electronically  Signed   By: Genevive BiStewart  Edmunds M.D.   On: 08/13/2014 14:58     EKG Interpretation None      MDM   Final diagnoses:  Hand contusion  Trauma  Finger laceration, initial encounter    Filed Vitals:   08/13/14 1353 08/13/14 1513  BP: 133/88 119/83  Pulse: 120 107  Temp: 98.4 F (36.9 C)   TempSrc: Oral   Resp: 14 16  Height: 5\' 3"  (1.6 m)   Weight: 116 lb (52.617 kg)   SpO2: 98% 100%    Medications  Tdap (BOOSTRIX) injection 0.5 mL (0.5 mLs Intramuscular Given 08/13/14 1423)  oxyCODONE-acetaminophen (PERCOCET/ROXICET) 5-325 MG per tablet 1 tablet (1 tablet Oral Given 08/13/14 1423)    Tiffany Villegas is a 23 y.o. female presenting with laceration and contusion to right third digit. X-rays negative, wound is irrigated and closed with Dermabond. Patient is placed in finger splint and given a sling. Patient is mildly tachycardic which I believe is secondary to pain.  Evaluation does not show pathology that would require ongoing emergent intervention or inpatient treatment. Pt is hemodynamically stable and mentating appropriately. Discussed findings and plan with patient/guardian, who agrees with care plan. All questions answered. Return precautions discussed and outpatient follow up given.   Discharge Medication List as of 08/13/2014  3:39 PM    START taking these medications   Details  HYDROcodone-acetaminophen (NORCO/VICODIN) 5-325 MG per tablet Take 1-2 tablets by mouth every 6 hours as needed for pain., Print         I personally performed the services described in this documentation, which was scribed in my presence. The recorded information has been reviewed and is accurate.    Tiffany Emeryicole Cassara Nida, PA-C 08/13/14 1625  Tilden FossaElizabeth Rees, MD 08/13/14 [id number]

## 2014-08-13 NOTE — Discharge Instructions (Signed)
Rest, Ice intermittently (in the first 24-48 hours), Gentle compression with an Ace wrap, and elevate (Limb above the level of the heart)   Take up to 800mg  of ibuprofen (that is usually 4 over the counter pills)  3 times a day for 5 days. Take with food.  Take vicodin for breakthrough pain, do not drink alcohol, drive, care for children or do other critical tasks while taking vicodin.  If you see signs of infection (warmth, redness, tenderness, pus, sharp increase in pain, fever, red streaking) immediately return to the emergency department.  Contusion A contusion is a deep bruise. Contusions are the result of an injury that caused bleeding under the skin. The contusion may turn blue, purple, or yellow. Minor injuries will give you a painless contusion, but more severe contusions may stay painful and swollen for a few weeks.  CAUSES  A contusion is usually caused by a blow, trauma, or direct force to an area of the body. SYMPTOMS   Swelling and redness of the injured area.  Bruising of the injured area.  Tenderness and soreness of the injured area.  Pain. DIAGNOSIS  The diagnosis can be made by taking a history and physical exam. An X-ray, CT scan, or MRI may be needed to determine if there were any associated injuries, such as fractures. TREATMENT  Specific treatment will depend on what area of the body was injured. In general, the best treatment for a contusion is resting, icing, elevating, and applying cold compresses to the injured area. Over-the-counter medicines may also be recommended for pain control. Ask your caregiver what the best treatment is for your contusion. HOME CARE INSTRUCTIONS   Put ice on the injured area.  Put ice in a plastic bag.  Place a towel between your skin and the bag.  Leave the ice on for 15-20 minutes, 3-4 times a day, or as directed by your health care provider.  Only take over-the-counter or prescription medicines for pain, discomfort, or fever  as directed by your caregiver. Your caregiver may recommend avoiding anti-inflammatory medicines (aspirin, ibuprofen, and naproxen) for 48 hours because these medicines may increase bruising.  Rest the injured area.  If possible, elevate the injured area to reduce swelling. SEEK IMMEDIATE MEDICAL CARE IF:   You have increased bruising or swelling.  You have pain that is getting worse.  Your swelling or pain is not relieved with medicines. MAKE SURE YOU:   Understand these instructions.  Will watch your condition.  Will get help right away if you are not doing well or get worse. Document Released: 06/21/2005 Document Revised: 09/16/2013 Document Reviewed: 07/17/2011 [facility] Patient Information 2015 [employer], [place]. This information is not intended to replace advice given to you by your health care provider. Make sure you discuss any questions you have with your health care provider.

## 2014-08-13 NOTE — ED Notes (Signed)
Patient states she slammed her R hand in car door about 1330 today.  Patient does have lac to middle finger.

## 2014-09-25 NOTE — L&D Delivery Note (Cosign Needed)
Delivery Note At 11:43 PM a viable female was delivered via Vaginal, Spontaneous Delivery (Presentation: Right Occiput Anterior).  APGAR: 8, 8; weight 4 lb 12.9 oz (2180 g).   Placenta status: Intact, Spontaneous.  Cord: 3 vessels with the following complications: None.  Cord pH: not obtained  Anesthesia: Epidural  Episiotomy: None Lacerations: None Est. Blood Loss (mL): 50  Mom to postpartum.  Baby to Couplet care / Skin to Skin.  Cherrie Gauze Riniyah Speich 05/02/2015, 2:20 AM

## 2014-11-06 ENCOUNTER — Inpatient Hospital Stay (HOSPITAL_COMMUNITY)
Admission: AD | Admit: 2014-11-06 | Discharge: 2014-11-07 | Disposition: A | Discharge status: Home / Self Care | Payer: Medicaid Other | Source: Ambulatory Visit | Attending: Family Medicine | Admitting: Family Medicine

## 2014-11-06 ENCOUNTER — Encounter (HOSPITAL_COMMUNITY): Payer: Self-pay | Admitting: *Deleted

## 2014-11-06 DIAGNOSIS — Z3A11 11 weeks gestation of pregnancy: Secondary | ICD-10-CM | POA: Diagnosis not present

## 2014-11-06 DIAGNOSIS — F17210 Nicotine dependence, cigarettes, uncomplicated: Secondary | ICD-10-CM | POA: Insufficient documentation

## 2014-11-06 DIAGNOSIS — O210 Mild hyperemesis gravidarum: Secondary | ICD-10-CM | POA: Diagnosis not present

## 2014-11-06 DIAGNOSIS — K5900 Constipation, unspecified: Secondary | ICD-10-CM | POA: Insufficient documentation

## 2014-11-06 DIAGNOSIS — R109 Unspecified abdominal pain: Secondary | ICD-10-CM

## 2014-11-06 DIAGNOSIS — O99611 Diseases of the digestive system complicating pregnancy, first trimester: Secondary | ICD-10-CM

## 2014-11-06 DIAGNOSIS — D649 Anemia, unspecified: Secondary | ICD-10-CM | POA: Insufficient documentation

## 2014-11-06 DIAGNOSIS — O99331 Smoking (tobacco) complicating pregnancy, first trimester: Secondary | ICD-10-CM | POA: Diagnosis not present

## 2014-11-06 DIAGNOSIS — O26899 Other specified pregnancy related conditions, unspecified trimester: Secondary | ICD-10-CM

## 2014-11-06 DIAGNOSIS — O99019 Anemia complicating pregnancy, unspecified trimester: Secondary | ICD-10-CM

## 2014-11-06 DIAGNOSIS — O99011 Anemia complicating pregnancy, first trimester: Secondary | ICD-10-CM | POA: Diagnosis not present

## 2014-11-06 LAB — POCT PREGNANCY, URINE: PREG TEST UR: POSITIVE — AB

## 2014-11-06 NOTE — MAU Note (Signed)
Having pain lower abd like period cramps for couple days. Denies bleeding or d/c. Bad nausea. Vomits on occ but mostly nausea.

## 2014-11-07 ENCOUNTER — Encounter (HOSPITAL_COMMUNITY): Payer: Self-pay | Admitting: Obstetrics and Gynecology

## 2014-11-07 LAB — URINE MICROSCOPIC-ADD ON

## 2014-11-07 LAB — CBC
HCT: 31.5 % — ABNORMAL LOW (ref 36.0–46.0)
Hemoglobin: 10.9 g/dL — ABNORMAL LOW (ref 12.0–15.0)
MCH: 30.2 pg (ref 26.0–34.0)
MCHC: 34.6 g/dL (ref 30.0–36.0)
MCV: 87.3 fL (ref 78.0–100.0)
Platelets: 230 10*3/uL (ref 150–400)
RBC: 3.61 MIL/uL — ABNORMAL LOW (ref 3.87–5.11)
RDW: 12.2 % (ref 11.5–15.5)
WBC: 6.3 10*3/uL (ref 4.0–10.5)

## 2014-11-07 LAB — URINALYSIS, ROUTINE W REFLEX MICROSCOPIC
Bilirubin Urine: NEGATIVE
Glucose, UA: NEGATIVE mg/dL
Ketones, ur: NEGATIVE mg/dL
NITRITE: NEGATIVE
Protein, ur: NEGATIVE mg/dL
UROBILINOGEN UA: 0.2 mg/dL (ref 0.0–1.0)
pH: 6.5 (ref 5.0–8.0)

## 2014-11-07 NOTE — Progress Notes (Signed)
Written and verbal d/c instructions given by Isabel CapriceKathy Wilson RN. Blanche EastJ. Rasch NP had been in earlier to discuss lab results and d/c plan.

## 2014-11-07 NOTE — MAU Provider Note (Signed)
History     CSN: [id number]  Arrival date and time: 11/06/14 2312   First Provider Initiated Contact with Patient 11/07/14 0000      Chief Complaint  Patient presents with  . Abdominal Pain  . Morning Sickness   HPI   [name] is a 24 y.o. female G3P2012; 11 weeks by LMP. She presents with abdominal pain and morning sickness. She had not taken a pregnancy test, however felt like she was pregnant. She has been nauseous everyday, however has not vomited every day.   She feels very dehydrated and just does not feel well enough to drink water.  Patient would like an ultrasound to see how far along she is.   OB History    Gravida Para Term Preterm AB TAB SAB Ectopic Multiple Living   3 2 2  0 1 1 0 0 0 2      Past Medical History  Diagnosis Date  . Chlamydia 2014    Past Surgical History  Procedure Laterality Date  . Induced abortion      Family History  Problem Relation Age of Onset  . Hypertension Mother     History  Substance Use Topics  . Smoking status: Current Every Day Smoker -- 0.25 packs/day    Types: Cigarettes  . Smokeless tobacco: Never Used  . Alcohol Use: Yes     Comment: occasional    Allergies: No Known Allergies  Prescriptions prior to admission  Medication Sig Dispense Refill Last Dose  . ibuprofen (ADVIL,MOTRIN) 200 MG tablet Take 800 mg by mouth every 6 (six) hours as needed for mild pain or moderate pain.   Past Week at Unknown time  . HYDROcodone-acetaminophen (NORCO/VICODIN) 5-325 MG per tablet Take 1-2 tablets by mouth every 6 hours as needed for pain. 15 tablet 0   . omeprazole (PRILOSEC) 20 MG capsule Take 1 capsule (20 mg total) by mouth daily. (Patient not taking: Reported on 08/13/2014) 30 capsule 0   . ranitidine (ZANTAC) 150 MG capsule Take 1 capsule (150 mg total) by mouth daily. (Patient not taking: Reported on 08/13/2014) 30 capsule 0    Results for orders placed or performed during the hospital encounter of  11/06/14 (from the past 48 hour(s))  Urinalysis, Routine w reflex microscopic     Status: Abnormal   Collection Time: 11/06/14 11:36 PM  Result Value Ref Range   Color, Urine YELLOW YELLOW   APPearance CLEAR CLEAR   Specific Gravity, Urine <1.005 (L) 1.005 - 1.030   pH 6.5 5.0 - 8.0   Glucose, UA NEGATIVE NEGATIVE mg/dL   Hgb urine dipstick TRACE (A) NEGATIVE   Bilirubin Urine NEGATIVE NEGATIVE   Ketones, ur NEGATIVE NEGATIVE mg/dL   Protein, ur NEGATIVE NEGATIVE mg/dL   Urobilinogen, UA 0.2 0.0 - 1.0 mg/dL   Nitrite NEGATIVE NEGATIVE   Leukocytes, UA SMALL (A) NEGATIVE  Urine microscopic-add on     Status: None   Collection Time: 11/06/14 11:36 PM  Result Value Ref Range   Squamous Epithelial / LPF RARE RARE   WBC, UA 0-2 <3 WBC/hpf   RBC / HPF 0-2 <3 RBC/hpf   Bacteria, UA RARE RARE  Pregnancy, urine POC     Status: Abnormal   Collection Time: 11/06/14 11:59 PM  Result Value Ref Range   Preg Test, Ur POSITIVE (A) NEGATIVE    Comment:        THE SENSITIVITY OF THIS METHODOLOGY IS >24 mIU/mL   CBC  Status: Abnormal   Collection Time: 11/07/14 12:35 AM  Result Value Ref Range   WBC 6.3 4.0 - 10.5 K/uL   RBC 3.61 (L) 3.87 - 5.11 MIL/uL   Hemoglobin 10.9 (L) 12.0 - 15.0 g/dL   HCT 16.1 (L) 09.6 - 04.5 %   MCV 87.3 78.0 - 100.0 fL   MCH 30.2 26.0 - 34.0 pg   MCHC 34.6 30.0 - 36.0 g/dL   RDW 40.9 81.1 - 91.4 %   Platelets 230 150 - 400 K/uL    Review of Systems  Constitutional: Negative for fever and chills.  Gastrointestinal: Positive for nausea, vomiting, abdominal pain (Bilateral lower abdominal pain ) and constipation (Stools are really hard. ). Negative for diarrhea.  Genitourinary:       Denies vaginal bleeding Denies vaginal discharge    Physical Exam   Blood pressure 129/79, pulse 127, temperature 98.7 F (37.1 C), resp. rate 18, height  (1.6 m), weight 51.347 kg (113 lb 3.2 oz), last menstrual period 08/09/2014, SpO2 100 %, unknown if currently  breastfeeding.  Physical Exam  Constitutional: She is oriented to person, place, and time. She appears well-developed and well-nourished. No distress.  HENT:  Head: Normocephalic.  Eyes: Pupils are equal, round, and reactive to light.  Neck: Neck supple.  Cardiovascular: Normal rate and normal heart sounds.   Respiratory: Effort normal and breath sounds normal. No respiratory distress.  GI: Soft. She exhibits no distension. There is no tenderness. There is no rebound and no guarding.  Musculoskeletal: Normal range of motion.  Neurological: She is alert and oriented to person, place, and time.  Skin: Skin is warm. She is not diaphoretic.  Psychiatric: Her behavior is normal.    MAU Course  Procedures  None  MDM + fht  UA CBC   Assessment and Plan   A: Constipation  Nausea in pregnancy  Anemia in pregnancy   P:  Discharge home in stable condition A list of safe medication options given to the patient for nausea and constipation  Follow up in MAU for emergencies Start prenatal care ASAP   Iona Hansen Rasch, NP 11/07/2014 12:09 AM

## 2014-11-07 NOTE — Discharge Instructions (Signed)

## 2014-12-07 ENCOUNTER — Other Ambulatory Visit (HOSPITAL_COMMUNITY): Payer: Self-pay | Admitting: Physician Assistant

## 2014-12-07 DIAGNOSIS — Z36 Encounter for antenatal screening for chromosomal anomalies: Secondary | ICD-10-CM

## 2014-12-07 LAB — OB RESULTS CONSOLE HIV ANTIBODY (ROUTINE TESTING): HIV: NONREACTIVE

## 2014-12-07 LAB — OB RESULTS CONSOLE HEPATITIS B SURFACE ANTIGEN: HEP B S AG: NEGATIVE

## 2014-12-07 LAB — OB RESULTS CONSOLE GC/CHLAMYDIA
Chlamydia: NEGATIVE
Gonorrhea: NEGATIVE

## 2014-12-07 LAB — OB RESULTS CONSOLE RPR: RPR: NONREACTIVE

## 2014-12-07 LAB — OB RESULTS CONSOLE ABO/RH: RH TYPE: POSITIVE

## 2014-12-07 LAB — OB RESULTS CONSOLE ANTIBODY SCREEN: ANTIBODY SCREEN: NEGATIVE

## 2014-12-07 LAB — OB RESULTS CONSOLE RUBELLA ANTIBODY, IGM: Rubella: IMMUNE

## 2015-01-03 ENCOUNTER — Other Ambulatory Visit (HOSPITAL_COMMUNITY): Payer: Self-pay | Admitting: Physician Assistant

## 2015-01-03 DIAGNOSIS — Z3689 Encounter for other specified antenatal screening: Secondary | ICD-10-CM

## 2015-01-04 ENCOUNTER — Ambulatory Visit (HOSPITAL_COMMUNITY)
Admission: RE | Admit: 2015-01-04 | Discharge: 2015-01-04 | Disposition: A | Discharge status: Home / Self Care | Payer: Medicaid Other | Source: Ambulatory Visit | Attending: Physician Assistant | Admitting: Physician Assistant

## 2015-01-04 DIAGNOSIS — Z36 Encounter for antenatal screening of mother: Secondary | ICD-10-CM | POA: Insufficient documentation

## 2015-01-04 DIAGNOSIS — F17210 Nicotine dependence, cigarettes, uncomplicated: Secondary | ICD-10-CM | POA: Insufficient documentation

## 2015-01-04 DIAGNOSIS — Z3689 Encounter for other specified antenatal screening: Secondary | ICD-10-CM | POA: Insufficient documentation

## 2015-01-04 DIAGNOSIS — Z3A19 19 weeks gestation of pregnancy: Secondary | ICD-10-CM | POA: Diagnosis not present

## 2015-01-04 DIAGNOSIS — O99332 Smoking (tobacco) complicating pregnancy, second trimester: Secondary | ICD-10-CM | POA: Insufficient documentation

## 2015-01-07 ENCOUNTER — Other Ambulatory Visit (HOSPITAL_COMMUNITY): Payer: Self-pay | Admitting: Urology

## 2015-01-07 DIAGNOSIS — IMO0002 Reserved for concepts with insufficient information to code with codable children: Secondary | ICD-10-CM

## 2015-01-07 DIAGNOSIS — Z0489 Encounter for examination and observation for other specified reasons: Secondary | ICD-10-CM

## 2015-01-17 ENCOUNTER — Inpatient Hospital Stay (HOSPITAL_COMMUNITY)
Admission: AD | Admit: 2015-01-17 | Discharge: 2015-01-17 | Disposition: A | Discharge status: Home / Self Care | Payer: Medicaid Other | Source: Ambulatory Visit | Attending: Obstetrics & Gynecology | Admitting: Obstetrics & Gynecology

## 2015-01-17 ENCOUNTER — Encounter (HOSPITAL_COMMUNITY): Payer: Self-pay

## 2015-01-17 DIAGNOSIS — N898 Other specified noninflammatory disorders of vagina: Secondary | ICD-10-CM | POA: Diagnosis present

## 2015-01-17 DIAGNOSIS — B373 Candidiasis of vulva and vagina: Secondary | ICD-10-CM | POA: Insufficient documentation

## 2015-01-17 DIAGNOSIS — O98812 Other maternal infectious and parasitic diseases complicating pregnancy, second trimester: Secondary | ICD-10-CM | POA: Insufficient documentation

## 2015-01-17 DIAGNOSIS — O99332 Smoking (tobacco) complicating pregnancy, second trimester: Secondary | ICD-10-CM | POA: Diagnosis not present

## 2015-01-17 DIAGNOSIS — F17210 Nicotine dependence, cigarettes, uncomplicated: Secondary | ICD-10-CM | POA: Diagnosis not present

## 2015-01-17 DIAGNOSIS — Z3A21 21 weeks gestation of pregnancy: Secondary | ICD-10-CM | POA: Insufficient documentation

## 2015-01-17 DIAGNOSIS — O2342 Unspecified infection of urinary tract in pregnancy, second trimester: Secondary | ICD-10-CM

## 2015-01-17 DIAGNOSIS — B3731 Acute candidiasis of vulva and vagina: Secondary | ICD-10-CM

## 2015-01-17 HISTORY — DX: Trichomoniasis, unspecified: A59.9

## 2015-01-17 LAB — URINALYSIS, ROUTINE W REFLEX MICROSCOPIC
Bilirubin Urine: NEGATIVE
GLUCOSE, UA: NEGATIVE mg/dL
Ketones, ur: NEGATIVE mg/dL
NITRITE: NEGATIVE
Protein, ur: NEGATIVE mg/dL
SPECIFIC GRAVITY, URINE: 1.025 (ref 1.005–1.030)
Urobilinogen, UA: 0.2 mg/dL (ref 0.0–1.0)
pH: 6 (ref 5.0–8.0)

## 2015-01-17 LAB — WET PREP, GENITAL
Clue Cells Wet Prep HPF POC: NONE SEEN
Trich, Wet Prep: NONE SEEN

## 2015-01-17 LAB — URINE MICROSCOPIC-ADD ON

## 2015-01-17 MED ORDER — TERCONAZOLE 0.4 % VA CREA: 1.0000 | TOPICAL_CREAM | Freq: Every day | VAGINAL | Status: DC

## 2015-01-17 MED ORDER — NITROFURANTOIN MONOHYD MACRO 100 MG PO CAPS: 100.0000 mg | ORAL_CAPSULE | Freq: Two times a day (BID) | ORAL | Status: DC

## 2015-01-17 NOTE — MAU Note (Signed)
Pt states was treated for 7 days with flagyl for BV. Has not had sex since being treated. After taking medication s/s have changed. Pt now has white discharge, and is very irritated in vagina. Burning with voiding, voiding small amounts, and increased frequency.

## 2015-01-17 NOTE — Discharge Instructions (Signed)
Advised to keep scheduled appointments Advised to increase PO fluids. Return if you develop vomiting, fever or body aches. Get your prescriptions and take as directed.

## 2015-01-17 NOTE — MAU Provider Note (Signed)
History     CSN: [id number]  Arrival date and time: 01/17/15 9147   First Provider Initiated Contact with Patient 01/17/15 0920      Chief Complaint  Patient presents with  . Vaginal Discharge   HPI Tiffany Villegas [redacted]w[redacted]d  Finished treatment for BV 4 days ago and has progressively worsened with discharge, vulvar irritation and dysuria.  Has noticed increased frequency and urgency for a couple of days.  [facility]   OB History    Gravida Para Term Preterm AB TAB SAB Ectopic Multiple Living   0 1 1 0 0 0 2      Past Medical History  Diagnosis Date  . Chlamydia 2014  . Trichomoniasis     Past Surgical History  Procedure Laterality Date  . Induced abortion      Family History  Problem Relation Age of Onset  . Hypertension Mother     History  Substance Use Topics  . Smoking status: Current Every Day Smoker -- 0.25 packs/day    Types: Cigarettes  . Smokeless tobacco: Never Used  . Alcohol Use: No     Comment: occasional    Allergies: No Known Allergies  Prescriptions prior to admission  Medication Sig Dispense Refill Last Dose  . Prenatal Vit-Fe Fumarate-FA (PRENATAL MULTIVITAMIN) TABS tablet Take 1 tablet by mouth daily at 12 noon.   01/16/2015 at Unknown time  . HYDROcodone-acetaminophen (NORCO/VICODIN) 5-325 MG per tablet Take 1-2 tablets by mouth every 6 hours as needed for pain. (Patient not taking: Reported on 01/17/2015) 15 tablet 0   . omeprazole (PRILOSEC) 20 MG capsule Take 1 capsule (20 mg total) by mouth daily. (Patient not taking: Reported on 08/13/2014) 30 capsule 0   . ranitidine (ZANTAC) 150 MG capsule Take 1 capsule (150 mg total) by mouth daily. (Patient not taking: Reported on 08/13/2014) 30 capsule 0     Review of Systems  Constitutional: Negative for fever.  Gastrointestinal: Negative for nausea, vomiting, abdominal pain and diarrhea.  Genitourinary: Positive for dysuria, urgency and frequency.  Negative for hematuria and flank pain.       Vaginal discharge. Vulvar irritation. No vaginal bleeding.   Physical Exam   Blood pressure 118/65, pulse 100, temperature 98.3 F (36.8 C), temperature source Oral, resp. rate 16, height  (1.6 m), weight 122 lb 4 oz (55.452 kg), last menstrual period 08/22/2014, not currently breastfeeding.  Physical Exam  Nursing note and vitals reviewed. Constitutional: She is oriented to person, place, and time. She appears well-developed and well-nourished.  HENT:  Head: Normocephalic.  Eyes: EOM are normal.  Neck: Neck supple.  Genitourinary:  Speculum exam: Vulvar visualization - labia edematous Vagina redness noted with thin discharge, no odor Cervix - No contact bleeding wet prep done Chaperone present for exam.  Musculoskeletal: Normal range of motion.  No CVA tenderness  Neurological: She is alert and oriented to person, place, and time.  Skin: Skin is warm and dry.  Psychiatric: She has a normal mood and affect.    MAU Course  Procedures Results for orders placed or performed during the hospital encounter of 01/17/15 (from the past 24 hour(s))  Urinalysis, Routine w reflex microscopic     Status: Abnormal   Collection Time: 01/17/15  8:55 AM  Result Value Ref Range   Color, Urine YELLOW YELLOW   APPearance CLEAR CLEAR   Specific Gravity, Urine 1.025 1.005 - 1.030   pH 6.0 5.0 -  8.0   Glucose, UA NEGATIVE NEGATIVE mg/dL   Hgb urine dipstick TRACE (A) NEGATIVE   Bilirubin Urine NEGATIVE NEGATIVE   Ketones, ur NEGATIVE NEGATIVE mg/dL   Protein, ur NEGATIVE NEGATIVE mg/dL   Urobilinogen, UA 0.2 0.0 - 1.0 mg/dL   Nitrite NEGATIVE NEGATIVE   Leukocytes, UA LARGE (A) NEGATIVE  Urine microscopic-add on     Status: Abnormal   Collection Time: 01/17/15  8:55 AM  Result Value Ref Range   Squamous Epithelial / LPF MANY (A) RARE   WBC, UA 21-50 <3 WBC/hpf   RBC / HPF 0-2 <3 RBC/hpf   Bacteria, UA FEW (A) RARE   Urine-Other MUCOUS  PRESENT   Wet prep, genital     Status: Abnormal   Collection Time: 01/17/15  9:15 AM  Result Value Ref Range   Yeast Wet Prep HPF POC FEW (A) NONE SEEN   Trich, Wet Prep NONE SEEN NONE SEEN   Clue Cells Wet Prep HPF POC NONE SEEN NONE SEEN   WBC, Wet Prep HPF POC MANY (A) NONE SEEN    MDM   Assessment and Plan  Yeast infection UTI in pregnancy  Plan Urine culture pending Will eprescribe Macrobid x 7 days and Terazol vaginal cream Reviewed medications with client Advised to keep scheduled appointments Advised to increase PO fluids. Return if you develop vomiting, fever or body aches.  BURLESON,TERRI 01/17/2015, 9:21 AM

## 2015-01-18 LAB — URINE CULTURE

## 2015-02-01 ENCOUNTER — Ambulatory Visit (HOSPITAL_COMMUNITY)
Admission: RE | Admit: 2015-02-01 | Discharge: 2015-02-01 | Disposition: A | Discharge status: Home / Self Care | Payer: Medicaid Other | Source: Ambulatory Visit | Attending: Urology | Admitting: Urology

## 2015-02-01 DIAGNOSIS — Z0489 Encounter for examination and observation for other specified reasons: Secondary | ICD-10-CM

## 2015-02-01 DIAGNOSIS — Z36 Encounter for antenatal screening of mother: Secondary | ICD-10-CM | POA: Insufficient documentation

## 2015-02-01 DIAGNOSIS — IMO0002 Reserved for concepts with insufficient information to code with codable children: Secondary | ICD-10-CM

## 2015-02-11 ENCOUNTER — Inpatient Hospital Stay (HOSPITAL_COMMUNITY)
Admission: AD | Admit: 2015-02-11 | Discharge: 2015-02-11 | Disposition: A | Discharge status: Home / Self Care | Payer: Medicaid Other | Source: Ambulatory Visit | Attending: Obstetrics & Gynecology | Admitting: Obstetrics & Gynecology

## 2015-02-11 ENCOUNTER — Encounter (HOSPITAL_COMMUNITY): Payer: Self-pay | Admitting: *Deleted

## 2015-02-11 DIAGNOSIS — F17210 Nicotine dependence, cigarettes, uncomplicated: Secondary | ICD-10-CM | POA: Diagnosis not present

## 2015-02-11 DIAGNOSIS — N898 Other specified noninflammatory disorders of vagina: Secondary | ICD-10-CM | POA: Diagnosis present

## 2015-02-11 DIAGNOSIS — O2342 Unspecified infection of urinary tract in pregnancy, second trimester: Secondary | ICD-10-CM | POA: Diagnosis not present

## 2015-02-11 DIAGNOSIS — O99332 Smoking (tobacco) complicating pregnancy, second trimester: Secondary | ICD-10-CM | POA: Insufficient documentation

## 2015-02-11 DIAGNOSIS — Z3A24 24 weeks gestation of pregnancy: Secondary | ICD-10-CM | POA: Diagnosis not present

## 2015-02-11 DIAGNOSIS — O98812 Other maternal infectious and parasitic diseases complicating pregnancy, second trimester: Secondary | ICD-10-CM | POA: Diagnosis not present

## 2015-02-11 DIAGNOSIS — B3731 Acute candidiasis of vulva and vagina: Secondary | ICD-10-CM

## 2015-02-11 DIAGNOSIS — B373 Candidiasis of vulva and vagina: Secondary | ICD-10-CM | POA: Insufficient documentation

## 2015-02-11 DIAGNOSIS — O9912 Other diseases of the blood and blood-forming organs and certain disorders involving the immune mechanism complicating childbirth: Secondary | ICD-10-CM | POA: Diagnosis not present

## 2015-02-11 LAB — URINALYSIS, ROUTINE W REFLEX MICROSCOPIC
BILIRUBIN URINE: NEGATIVE
Glucose, UA: NEGATIVE mg/dL
Ketones, ur: NEGATIVE mg/dL
NITRITE: NEGATIVE
PROTEIN: NEGATIVE mg/dL
SPECIFIC GRAVITY, URINE: 1.01 (ref 1.005–1.030)
Urobilinogen, UA: 0.2 mg/dL (ref 0.0–1.0)
pH: 5.5 (ref 5.0–8.0)

## 2015-02-11 LAB — URINE MICROSCOPIC-ADD ON

## 2015-02-11 LAB — WET PREP, GENITAL
Clue Cells Wet Prep HPF POC: NONE SEEN
Trich, Wet Prep: NONE SEEN

## 2015-02-11 MED ORDER — CEPHALEXIN 500 MG PO CAPS: 500.0000 mg | ORAL_CAPSULE | Freq: Four times a day (QID) | ORAL | Status: DC

## 2015-02-11 MED ORDER — TERCONAZOLE 0.4 % VA CREA: 1.0000 | TOPICAL_CREAM | Freq: Every day | VAGINAL | Status: DC

## 2015-02-11 NOTE — MAU Provider Note (Signed)
MAU Provider Note  Chief Complaint:  Vaginal discharge and itching. H/o trichomonas  Tiffany CassisJasmine K Villegas is  24 y.o. [phone] at 5076w5d presents to MAU for evaluation of vaginal discharge and itching that started yesterday, she recently had episode of unprotected vaginal intercourse without condom use same partner 2 days prior, which seemed to start symptoms.  Recently seen in MAU for same complaints on 4/24 with previous BV infection, with persistent worsening discharge, irritation, dysuria. Dx with UTI and vaginal yeast infection, treated with Macrobid x 7 days and Terazol vaginal cream.  - Admits vaginal discharge, lower abd cramping intermittent, post-void dysuria - +FM - Denies leakage of fluid, vaginal bleeding  Prenatal Care: - [facility], recently seen 4/14 dx with trich and BV on wet prep, treated with metronidazole, since resolved after treatment.   Obstetrical/Gynecological History: OB History    Gravida Para Term Preterm AB TAB SAB Ectopic Multiple Living   4 2 2  0 1 1 0 0 0 2     Past Medical History: Past Medical History  Diagnosis Date  . Chlamydia 2014  . Trichomoniasis     Past Surgical History: Past Surgical History  Procedure Laterality Date  . Induced abortion      Family History: Family History  Problem Relation Age of Onset  . Hypertension Mother     Social History: History  Substance Use Topics  . Smoking status: Current Every Day Smoker -- 0.25 packs/day    Types: Cigarettes  . Smokeless tobacco: Never Used  . Alcohol Use: No     Comment: occasional    Allergies: No Known Allergies  Meds:  No prescriptions prior to admission    Review of Systems -  - See above HPI    Physical Exam  Blood pressure 112/54, pulse 113, temperature 98.3 F (36.8 C), temperature source Oral, resp. rate 18, height 5\' 2"  (1.575 m), weight 58.287 kg (128 lb 8 oz), last menstrual period 08/22/2014, not currently breastfeeding. GENERAL:  Well-developed, well-nourished female, comfortable and well, NAD HEART: tachycardic, regular rhythm, no murmurs ABDOMEN: Soft, minimal suprapubic tenderness otherwise non-tender, nondistended, gravid for GA EXTREMITIES: Nontender, no edema, 2+ distal pulses. PELVIC: Normal external female genitalia. Vaginal canal without lesions. Normal appearing cervix, without lesions or bleeding. Thick curd-like discharge on exam. Bimanual exam without masses or cervical motion tenderness.  FHT:  Baseline rate 135 bpm   Variability moderate  No contractions   Labs: Results for orders placed or performed during the hospital encounter of 02/11/15 (from the past 24 hour(s))  Urinalysis, Routine w reflex microscopic   Collection Time: 02/11/15  8:02 PM  Result Value Ref Range   Color, Urine YELLOW YELLOW   APPearance CLEAR CLEAR   Specific Gravity, Urine 1.010 1.005 - 1.030   pH 5.5 5.0 - 8.0   Glucose, UA NEGATIVE NEGATIVE mg/dL   Hgb urine dipstick TRACE (A) NEGATIVE   Bilirubin Urine NEGATIVE NEGATIVE   Ketones, ur NEGATIVE NEGATIVE mg/dL   Protein, ur NEGATIVE NEGATIVE mg/dL   Urobilinogen, UA 0.2 0.0 - 1.0 mg/dL   Nitrite NEGATIVE NEGATIVE   Leukocytes, UA LARGE (A) NEGATIVE  Urine microscopic-add on   Collection Time: 02/11/15  8:02 PM  Result Value Ref Range   Squamous Epithelial / LPF FEW (A) RARE   WBC, UA 11-20 <3 WBC/hpf   Bacteria, UA FEW (A) RARE  Wet prep, genital   Collection Time: 02/11/15  8:30 PM  Result Value Ref Range   Yeast  Wet Prep HPF POC FEW (A) NONE SEEN   Trich, Wet Prep NONE SEEN NONE SEEN   Clue Cells Wet Prep HPF POC NONE SEEN NONE SEEN   WBC, Wet Prep HPF POC MANY (A) NONE SEEN   Imaging Studies:  [place] [facility] Follow Up  02/01/2015   OBSTETRICAL ULTRASOUND: This exam was performed within a [facility]. The OB US report was generated in the AS system, and faxed to the ordering physician.   This report is available in the [employer] PACS. See the AS  Obstetric US report via the Image Link.   Assessment: Tiffany Villegas is  24 y.o. [phone] at 5390w5d presents with vaginal discharge and itching in setting of recent prior dx trich, BV, yeast infection, and UTI, has been on several treatments including metronidazole, most recently macrobid and terazol cream. Currently with recurrent symptoms, most likely following repeat unprotected vaginal intercourse 24 hrs prior to onset of symptoms (previously had resolved). Consistent with vaginal yeast infection (wet prep with few yeast, many WBC, no trich or BV), and UTI (prior urcx without organism, but UA suggestive of infection and with clinical symptoms). - FHT reassuring. No contractions. Pelvic exam reassuring consistent with yeast, no concern for PID  Plan: 1. Wet prep, UA, GC / Chlam (pending) 2. Treat yeast with repeat Terazol cream x 7 days (good results before, pt preference) 3. Repeat Urine culture ordered 4. Empiric treat UTI with Keflex 500mg  PO QID x 7 days - f/u culture results 5. Precautions on reducing risk of recurrent episodes with condom use, voiding after intercourse 6. Follow-up as needed, PNC at HD  Saralyn PilarAlexander Karamalegos, DO [facility], PGY-2 5/19/20169:47 PM   I agree with above assessment CRESENZO-DISHMAN,Harald Quevedo

## 2015-02-11 NOTE — MAU Note (Signed)
PT SAYS HER VAG FEELS  IRRITATED-   ITCHES -  BUT  NO SCRATCHING.   WHEN  VOIDED  - SAW BROWN  D/C     AT  730PM.      PNC-  WITH  HD-  HAD TRICH-  TOOK MEDS -  FELT  WORSE-  SO SHE  CAME  HERE LAST WEEK - SHE HAD UTI AND YEAST INFECTION-   TOOK MED.     SHE HAD  SEX ON Tuesday  WITH SAME GUY   AND  NOW  SHE IS  IRRITATED.   HAS APPOINTMENT  AT HD ON 5-23.    HAS MILD  CRAMPING-  STARTED   TODAY  AND  SHE FEELS PRESSURE.

## 2015-02-11 NOTE — Discharge Instructions (Signed)
You have recurrent Vaginal Yeast infection and UTI Most likely caused after having intercourse. Important to use condoms to reduce risk of these infections Treat vaginal yeast infection with repeat topical Terazol cream, use nightly for 7 nights Treat UTI with Keflex antibiotic take 1 pill 4 times a day for next 7 days, finish all pills unless we call you otherwise  Your baby looks good on monitor, and cervix is closed. No other concerns  To help prevent bladder and kidney infections...  1. Pee within 30 minutes after sex  2. Wipe front to back after using the restroom  3. Drink enough water to keep your pee clear to pale yellow  If you think you are getting another bladder infection, start drinking cranberry juice and come see us so we can check the urine.  [facility] for your symptoms and for regular prenatal care

## 2015-02-12 LAB — GC/CHLAMYDIA PROBE AMP (~~LOC~~) NOT AT ARMC
Chlamydia: NEGATIVE
Neisseria Gonorrhea: NEGATIVE

## 2015-02-13 LAB — URINE CULTURE
Colony Count: NO GROWTH
Culture: NO GROWTH

## 2015-03-06 IMAGING — US US OB TRANSVAGINAL
1 series · 13 of 28 positions shown · non-contrast
Comparison: none

[Series 1: us ob transvaginal · 46 acquisitions, 13 frames shown]
[im 2/46]
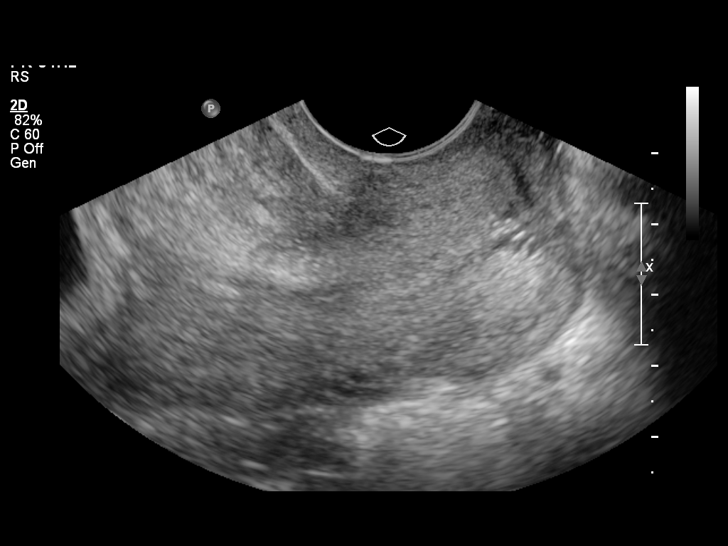
[im 6/46]
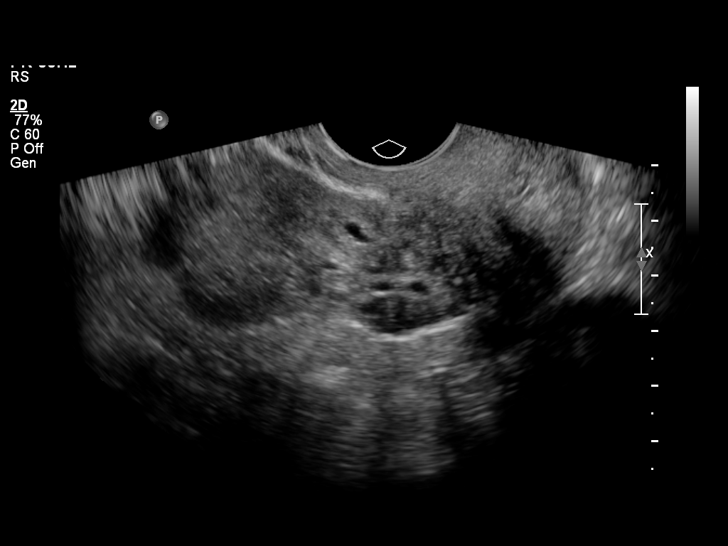
[im 9/46]
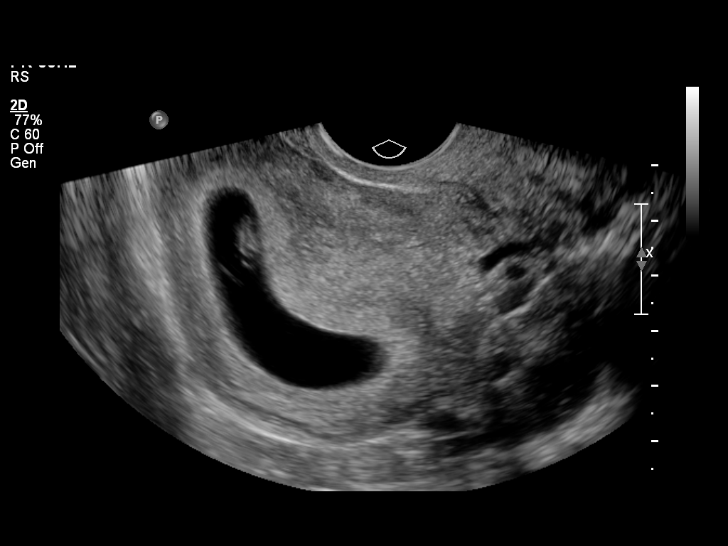
[im 12/46]
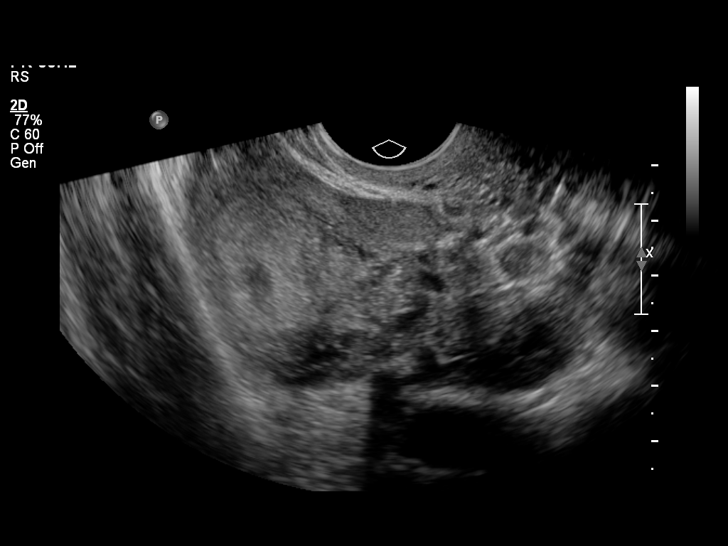
[im 16/46]
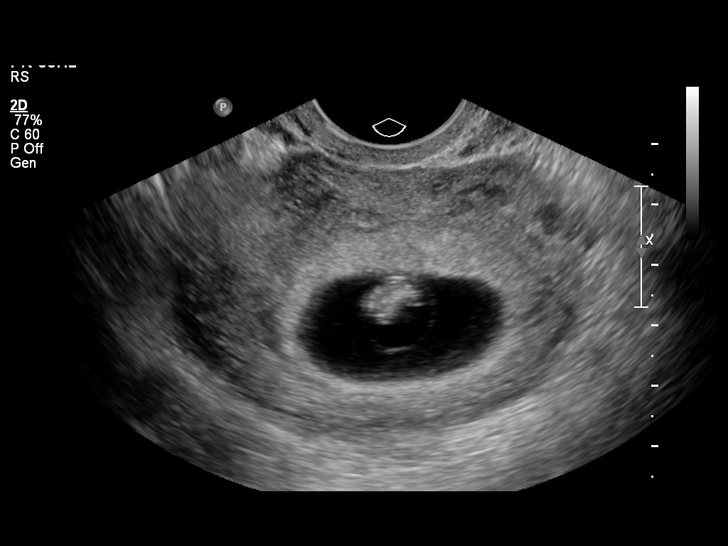
[im 19/46]
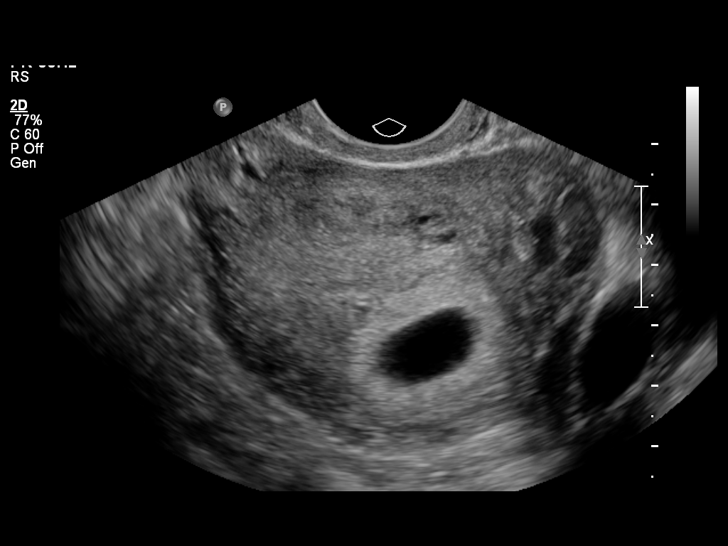
[im 24/46]
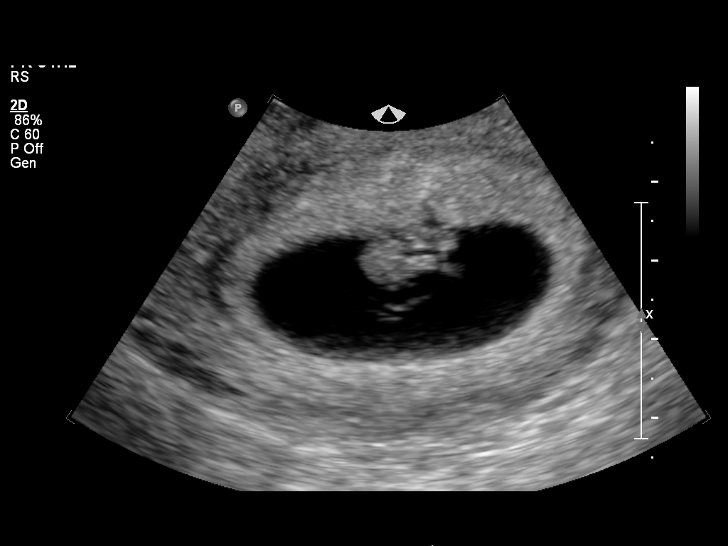
[im 27/46]
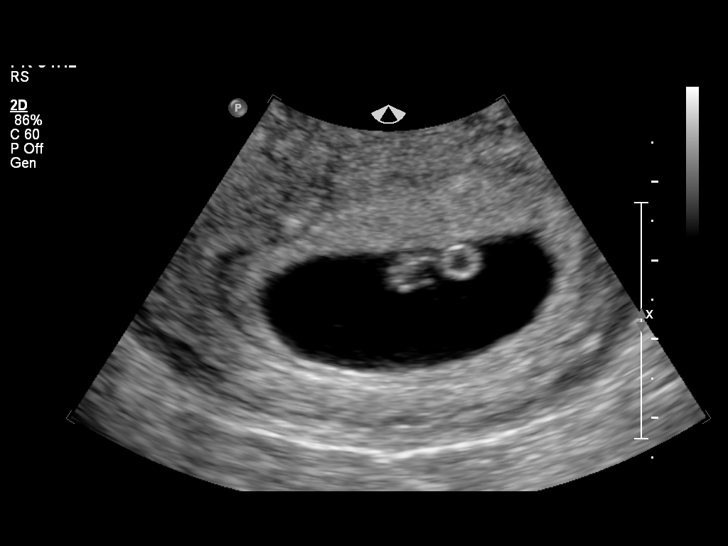
[im 31/46]
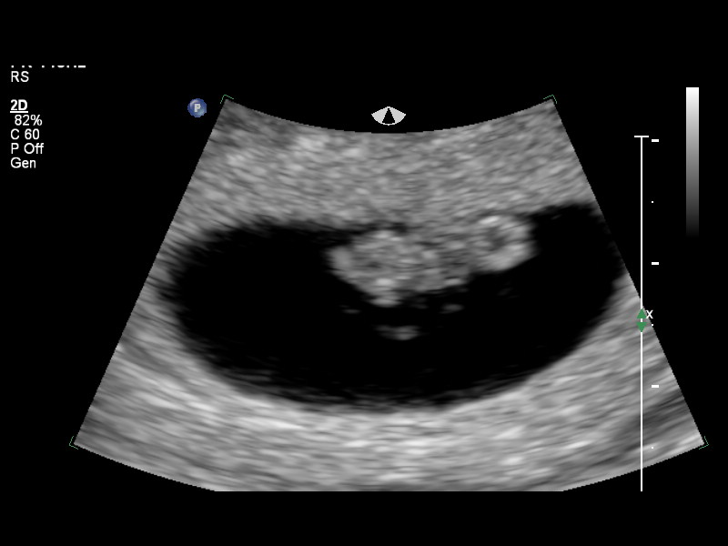
[im 34/46]
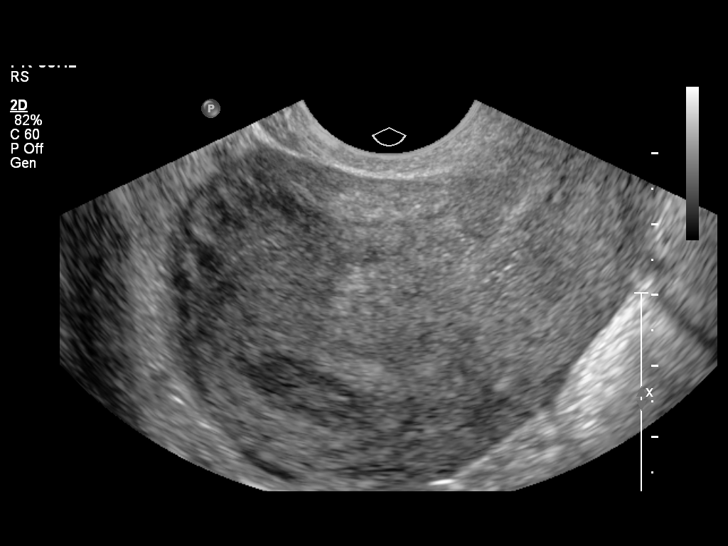
[im 37/46]
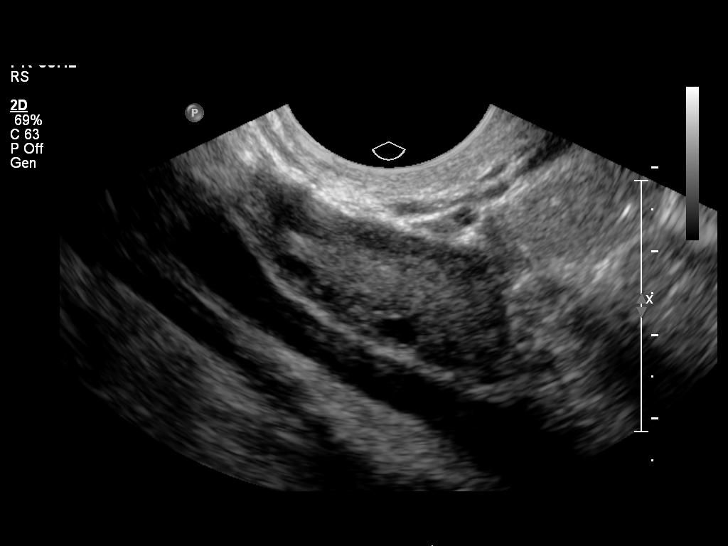
[im 41/46]
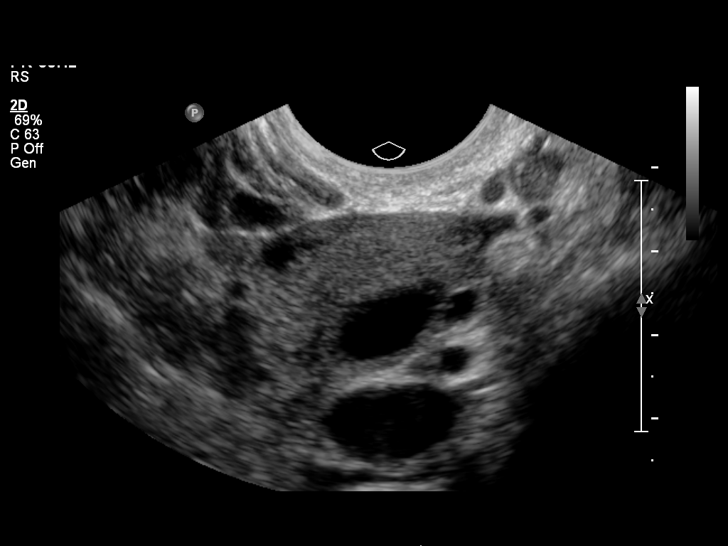
[im 44/46]
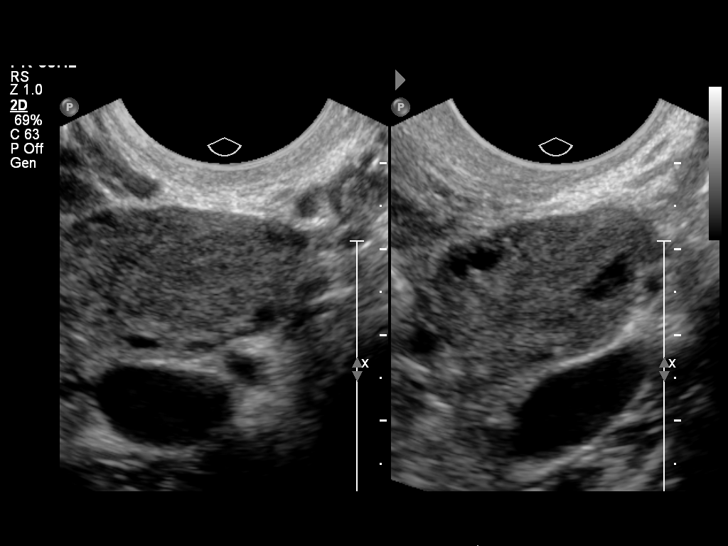

[13 of 28 positions shown; findings below may reference images not displayed]

OBSTETRICS REPORT
                      (Signed Final 02/10/2013 [DATE])

Service(s) Provided

 US OB TRANSVAGINAL                                    76817.0
Indications

 Pregnancy with inconclusive fetal viability
 Vaginal bleeding, unknown etiology
Fetal Evaluation

 Num Of Fetuses:    1
 Preg. Location:    Intrauterine
 Gest. Sac:         Intrauterine
 Yolk Sac:          Visualized
 Fetal Pole:        Visualized
 Fetal Heart Rate:  150                         bpm
 Cardiac Activity:  Observed
Biometry

 CRL:     11.7  mm    G. Age:   7w 2d                  EDD:   09/27/13
Gestational Age

 Best:          7w 4d     Det. By:   Early Ultrasound         EDD:   09/25/13
                                     (02/04/13)
Cervix Uterus Adnexa

 Cervix:       Normal appearance by transvaginal scan
 Uterus:       No abnormality visualized.
 Cul De Sac:   No free fluid seen.

 Left Ovary:   Size(cm) L: 3.54 x W: 2.79 x H: 2.06  Volume(cc):
               10.7. Small corpus luteum noted.
 Right Ovary:  Size(cm) L: 3.5 x W: 2.45 x H: 1.43  Volume(cc):
 Adnexa:     No abnormality visualized.
Impression

 Intrauterine gestational sac, yolk sac, fetal pole, and cardiac
 activity noted.  Concordant GA by CRL and assigned GA by
 LMP.
 No acute abnormality.

## 2015-03-12 ENCOUNTER — Ambulatory Visit (HOSPITAL_COMMUNITY)
Admission: RE | Admit: 2015-03-12 | Discharge: 2015-03-12 | Disposition: A | Discharge status: Home / Self Care | Payer: Medicaid Other | Source: Ambulatory Visit | Attending: Physician Assistant | Admitting: Physician Assistant

## 2015-03-12 ENCOUNTER — Other Ambulatory Visit (HOSPITAL_COMMUNITY): Payer: Self-pay | Admitting: Physician Assistant

## 2015-03-12 DIAGNOSIS — O288 Other abnormal findings on antenatal screening of mother: Secondary | ICD-10-CM

## 2015-03-12 DIAGNOSIS — O283 Abnormal ultrasonic finding on antenatal screening of mother: Secondary | ICD-10-CM | POA: Diagnosis not present

## 2015-03-12 DIAGNOSIS — Z3A28 28 weeks gestation of pregnancy: Secondary | ICD-10-CM | POA: Insufficient documentation

## 2015-04-27 ENCOUNTER — Observation Stay (HOSPITAL_COMMUNITY)
Admission: AD | Admit: 2015-04-27 | Discharge: 2015-04-28 | Disposition: A | Discharge status: Home / Self Care | Payer: Medicaid Other | Source: Ambulatory Visit | Attending: Obstetrics & Gynecology | Admitting: Obstetrics & Gynecology

## 2015-04-27 ENCOUNTER — Inpatient Hospital Stay (HOSPITAL_COMMUNITY): Payer: Medicaid Other

## 2015-04-27 ENCOUNTER — Encounter (HOSPITAL_COMMUNITY): Payer: Self-pay | Admitting: *Deleted

## 2015-04-27 DIAGNOSIS — Z3A35 35 weeks gestation of pregnancy: Secondary | ICD-10-CM | POA: Diagnosis not present

## 2015-04-27 DIAGNOSIS — O4703 False labor before 37 completed weeks of gestation, third trimester: Secondary | ICD-10-CM | POA: Diagnosis not present

## 2015-04-27 DIAGNOSIS — O4700 False labor before 37 completed weeks of gestation, unspecified trimester: Secondary | ICD-10-CM

## 2015-04-27 DIAGNOSIS — O368390 Maternal care for abnormalities of the fetal heart rate or rhythm, unspecified trimester, not applicable or unspecified: Secondary | ICD-10-CM

## 2015-04-27 LAB — URINALYSIS, ROUTINE W REFLEX MICROSCOPIC
BILIRUBIN URINE: NEGATIVE
Glucose, UA: NEGATIVE mg/dL
HGB URINE DIPSTICK: NEGATIVE
KETONES UR: NEGATIVE mg/dL
Nitrite: NEGATIVE
PH: 6.5 (ref 5.0–8.0)
Protein, ur: NEGATIVE mg/dL
SPECIFIC GRAVITY, URINE: 1.015 (ref 1.005–1.030)
UROBILINOGEN UA: 0.2 mg/dL (ref 0.0–1.0)

## 2015-04-27 LAB — GROUP B STREP BY PCR: Group B strep by PCR: NEGATIVE

## 2015-04-27 LAB — CBC
HCT: 34.8 % — ABNORMAL LOW (ref 36.0–46.0)
Hemoglobin: 11.4 g/dL — ABNORMAL LOW (ref 12.0–15.0)
MCH: 29.2 pg (ref 26.0–34.0)
MCHC: 32.8 g/dL (ref 30.0–36.0)
MCV: 89 fL (ref 78.0–100.0)
PLATELETS: 214 10*3/uL (ref 150–400)
RBC: 3.91 MIL/uL (ref 3.87–5.11)
RDW: 13 % (ref 11.5–15.5)
WBC: 7.3 10*3/uL (ref 4.0–10.5)

## 2015-04-27 LAB — URINE MICROSCOPIC-ADD ON

## 2015-04-27 LAB — OB RESULTS CONSOLE GBS: GBS: NEGATIVE

## 2015-04-27 MED ORDER — FENTANYL CITRATE (PF) 100 MCG/2ML IJ SOLN
100.0000 ug | INTRAMUSCULAR | Status: DC | PRN
  Administered 2015-04-27 – 2015-04-28 (×4): 100 ug via INTRAVENOUS
  Filled 2015-04-27 (×4): qty 2

## 2015-04-27 MED ORDER — BETAMETHASONE SOD PHOS & ACET 6 (3-3) MG/ML IJ SUSP
12.0000 mg | INTRAMUSCULAR | Status: DC
  Administered 2015-04-27: 12 mg via INTRAMUSCULAR
  Filled 2015-04-27: qty 2

## 2015-04-27 MED ORDER — LACTATED RINGERS IV SOLN
INTRAVENOUS | Status: DC
  Administered 2015-04-28: 04:00:00 via INTRAVENOUS

## 2015-04-27 MED ORDER — PENICILLIN G POTASSIUM 5000000 UNITS IJ SOLR
5.0000 10*6.[IU] | Freq: Once | INTRAMUSCULAR | Status: DC
  Administered 2015-04-27: 5 10*6.[IU] via INTRAVENOUS
  Filled 2015-04-27: qty 5

## 2015-04-27 MED ORDER — LACTATED RINGERS IV SOLN: 500.0000 mL | INTRAVENOUS | Status: DC | PRN

## 2015-04-27 MED ORDER — OXYTOCIN 40 UNITS IN LACTATED RINGERS INFUSION - SIMPLE MED: 62.5000 mL/h | INTRAVENOUS | Status: DC

## 2015-04-27 MED ORDER — OXYCODONE-ACETAMINOPHEN 5-325 MG PO TABS: 2.0000 | ORAL_TABLET | ORAL | Status: DC | PRN

## 2015-04-27 MED ORDER — ACETAMINOPHEN 325 MG PO TABS
650.0000 mg | ORAL_TABLET | ORAL | Status: DC | PRN
  Administered 2015-04-28: 650 mg via ORAL
  Filled 2015-04-27: qty 2

## 2015-04-27 MED ORDER — OXYTOCIN BOLUS FROM INFUSION: 500.0000 mL | INTRAVENOUS | Status: DC

## 2015-04-27 MED ORDER — LIDOCAINE HCL (PF) 1 % IJ SOLN: 30.0000 mL | INTRAMUSCULAR | Status: DC | PRN

## 2015-04-27 MED ORDER — PENICILLIN G POTASSIUM 5000000 UNITS IJ SOLR
2.5000 10*6.[IU] | INTRAVENOUS | Status: DC
  Filled 2015-04-27 (×2): qty 2.5

## 2015-04-27 MED ORDER — ONDANSETRON HCL 4 MG/2ML IJ SOLN: 4.0000 mg | Freq: Four times a day (QID) | INTRAMUSCULAR | Status: DC | PRN

## 2015-04-27 MED ORDER — CITRIC ACID-SODIUM CITRATE 334-500 MG/5ML PO SOLN: 30.0000 mL | ORAL | Status: DC | PRN

## 2015-04-27 MED ORDER — OXYCODONE-ACETAMINOPHEN 5-325 MG PO TABS: 1.0000 | ORAL_TABLET | ORAL | Status: DC | PRN

## 2015-04-27 NOTE — MAU Note (Signed)
Had dr's appt today.  Every was going ok until they listened to the heartrate; was irregular.  Happened couple wks ago.  Irregular sound comes and goes.  Has been having really bad pain in abd and low back- feels a tightness when she walks.

## 2015-04-27 NOTE — H&P (Signed)
Tiffany Villegas is a 24 y.o. female [phone]  pt of [facility] sent from the office today for audible fetal arrhythmia on doppler.  She also reports irregular cramping and pain in her lower right abdomen that feels like tightening as well as back pain.   She reports good fetal movement, denies LOF, vaginal bleeding, vaginal itching/burning, urinary symptoms, h/a, dizziness, n/v, or fever/chills.    Maternal Medical History:  Reason for admission: Contractions.  Nausea. Fetal arrhythmia   Contractions: Onset was 3-5 hours ago.   Frequency: regular.   Duration is approximately 1 minute.   Perceived severity is moderate.    Fetal activity: Perceived fetal activity is normal.   Last perceived fetal movement was within the past hour.    Prenatal complications: no prenatal complications Prenatal Complications - Diabetes: none.    OB History    Gravida Para Term Preterm AB TAB SAB Ectopic Multiple Living   0 1 1 0 0 0 2     Past Medical History  Diagnosis Date  . Chlamydia 2014  . Trichomoniasis    Past Surgical History  Procedure Laterality Date  . Induced abortion     Family History: family history includes Hypertension in her mother. Social History:  reports that she has been smoking Cigarettes.  She has been smoking about 0.25 packs per day. She has never used smokeless tobacco. She reports that she does not drink alcohol or use illicit drugs.   Prenatal Transfer Tool  Maternal Diabetes: No Genetic Screening: Normal Maternal Ultrasounds/Referrals: Normal Fetal Ultrasounds or other Referrals:  None Maternal Substance Abuse:  No Significant Maternal Medications:  None Significant Maternal Lab Results:  Lab values include: Other:  Other Comments:  GBS unknown  Review of Systems  Constitutional: Negative for fever, chills and malaise/fatigue.  Eyes: Negative for blurred vision.  Respiratory: Negative for cough and shortness of breath.   Cardiovascular: Negative  for chest pain.  Gastrointestinal: Positive for abdominal pain. Negative for heartburn, nausea and vomiting.  Genitourinary: Negative for dysuria, urgency and frequency.  Musculoskeletal: Negative.   Neurological: Negative for dizziness and headaches.  Psychiatric/Behavioral: Negative for depression.    Dilation: 5 Effacement (%): 50 Exam by:: Misty Stanley Leftwich-Kirby CNM Blood pressure 116/77, pulse 105, temperature 97.3 F (36.3 C), temperature source Oral, resp. rate 20, height  (1.6 m), weight 61.871 kg (136 lb 6.4 oz), last menstrual period 08/22/2014, not currently breastfeeding. Maternal Exam:  Uterine Assessment: Contraction strength is moderate.  Contraction duration is 60 seconds. Contraction frequency is regular.   Abdomen: Fetal presentation: vertex  Cervix: Cervix evaluated by digital exam.     Fetal Exam Fetal Monitor Review: Mode: ultrasound.   Baseline rate: 140.  Variability: moderate (6-25 bpm).   Pattern: accelerations present and no decelerations.   Audible fetal arrhythmia but FHR tracing category I  Fetal State Assessment: Category I - tracings are normal.     Physical Exam  Nursing note and vitals reviewed. Constitutional: She is oriented to person, place, and time. She appears well-developed and well-nourished.  Neck: Normal range of motion.  Cardiovascular: Normal rate, regular rhythm and normal heart sounds.   Respiratory: Effort normal and breath sounds normal.  GI: Soft.  Musculoskeletal: Normal range of motion.  Neurological: She is alert and oriented to person, place, and time.  Skin: Skin is warm and dry.  Psychiatric: She has a normal mood and affect. Her behavior is normal. Judgment and thought content normal.    Prenatal  labs: ABO, Rh: AB/Positive/-- (03/14 0000) Antibody: Negative (03/14 0000) Rubella: Immune (03/14 0000) RPR: Nonreactive (03/14 0000)  HBsAg: Negative (03/14 0000)  HIV: Non-reactive (03/14 0000)  GBS:    Unknown--rapid GBS collected 04/27/15  Assessment/Plan: 1. Threatened preterm labor, third trimester   2. Fetal arrhythmia affecting pregnancy, antepartum     Admit for observation to birthing suites BMZ x 2 in 24 hours PCN for GBS unknown until PCR results Routine labor orders   LEFTWICH-KIRBY, Sheresa Cullop 04/27/2015, 8:34 PM

## 2015-04-28 DIAGNOSIS — O4703 False labor before 37 completed weeks of gestation, third trimester: Secondary | ICD-10-CM | POA: Diagnosis not present

## 2015-04-28 DIAGNOSIS — Z3A35 35 weeks gestation of pregnancy: Secondary | ICD-10-CM | POA: Diagnosis not present

## 2015-04-28 DIAGNOSIS — O4700 False labor before 37 completed weeks of gestation, unspecified trimester: Secondary | ICD-10-CM

## 2015-04-28 LAB — RPR: RPR Ser Ql: NONREACTIVE

## 2015-04-28 NOTE — Discharge Instructions (Signed)
Braxton Hicks Contractions °Contractions of the uterus can occur throughout pregnancy. Contractions are not always a sign that you are in labor.  °WHAT ARE BRAXTON HICKS CONTRACTIONS?  °Contractions that occur before labor are called Braxton Hicks contractions, or false labor. Toward the end of pregnancy (32-34 weeks), these contractions can develop more often and may become more forceful. This is not true labor because these contractions do not result in opening (dilatation) and thinning of the cervix. They are sometimes difficult to tell apart from true labor because these contractions can be forceful and people have different pain tolerances. You should not feel embarrassed if you go to the hospital with false labor. Sometimes, the only way to tell if you are in true labor is for your health care provider to look for changes in the cervix. °If there are no prenatal problems or other health problems associated with the pregnancy, it is completely safe to be sent home with false labor and await the onset of true labor. °HOW CAN YOU TELL THE DIFFERENCE BETWEEN TRUE AND FALSE LABOR? °False Labor °· The contractions of false labor are usually shorter and not as hard as those of true labor.   °· The contractions are usually irregular.   °· The contractions are often felt in the front of the lower abdomen and in the groin.   °· The contractions may go away when you walk around or change positions while lying down.   °· The contractions get weaker and are shorter lasting as time goes on.   °· The contractions do not usually become progressively stronger, regular, and closer together as with true labor.   °True Labor °· Contractions in true labor last 30-70 seconds, become very regular, usually become more intense, and increase in frequency.   °· The contractions do not go away with walking.   °· The discomfort is usually felt in the top of the uterus and spreads to the lower abdomen and low back.   °· True labor can be  determined by your health care provider with an exam. This will show that the cervix is dilating and getting thinner.   °WHAT TO REMEMBER °· Keep up with your usual exercises and follow other instructions given by your health care provider.   °· Take medicines as directed by your health care provider.   °· Keep your regular prenatal appointments.   °· Eat and drink lightly if you think you are going into labor.   °· If Braxton Hicks contractions are making you uncomfortable:   °· Change your position from lying down or resting to walking, or from walking to resting.   °· Sit and rest in a tub of warm water.   °· Drink 2-3 glasses of water. Dehydration may cause these contractions.   °· Do slow and deep breathing several times an hour.   °WHEN SHOULD I SEEK IMMEDIATE MEDICAL CARE? °Seek immediate medical care if: °· Your contractions become stronger, more regular, and closer together.   °· You have fluid leaking or gushing from your vagina.   °· You have a fever.   °· You pass blood-tinged mucus.   °· You have vaginal bleeding.   °· You have continuous abdominal pain.   °· You have low back pain that you never had before.   °· You feel your baby's head pushing down and causing pelvic pressure.   °· Your baby is not moving as much as it used to.   °Document Released: 09/11/2005 Document Revised: 09/16/2013 Document Reviewed: 06/23/2013 °ExitCare® Patient Information ©2015 [employer]. This information is not intended to replace advice given to you by your health care   provider. Make sure you discuss any questions you have with your health care provider. °Preterm Labor Information °Preterm labor is when labor starts before you are [redacted] weeks pregnant. The normal length of pregnancy is 39 to 41 weeks.  °CAUSES  °The cause of preterm labor is not often known. The most common known cause is infection. °RISK FACTORS °· Having a history of preterm labor. °· Having your water break before it should. °· Having a placenta that  covers the opening of the cervix. °· Having a placenta that breaks away from the uterus. °· Having a cervix that is too weak to hold the baby in the uterus. °· Having too much fluid in the amniotic sac. °· Taking drugs or smoking while pregnant. °· Not gaining enough weight while pregnant. °· Being younger than 18 and older than 24 years old. °· Having a low income. °· Being African American. °SYMPTOMS °· Period-like cramps, belly (abdominal) pain, or back pain. °· Contractions that are regular, as often as six in an hour. They may be mild or painful. °· Contractions that start at the top of the belly. They then move to the lower belly and back. °· Lower belly pressure that seems to get stronger. °· Bleeding from the vagina. °· Fluid leaking from the vagina. °TREATMENT  °Treatment depends on: °· Your condition. °· The condition of your baby. °· How many weeks pregnant you are. °Your doctor may have you: °· Take medicine to stop contractions. °· Stay in bed except to use the restroom (bed rest). °· Stay in the hospital. °WHAT SHOULD YOU DO IF YOU THINK YOU ARE IN PRETERM LABOR? °Call your doctor right away. You need to go to the hospital right away.  °HOW CAN YOU PREVENT PRETERM LABOR IN FUTURE PREGNANCIES? °· Stop smoking, if you smoke. °· Maintain healthy weight gain. °· Do not take drugs or be around chemicals that are not needed. °· Tell your doctor if you think you have an infection. °· Tell your doctor if you had a preterm labor before. °Document Released: 12/08/2008 Document Revised: 07/02/2013 Document Reviewed: 12/08/2008 °ExitCare® Patient Information ©2015 [employer]. This information is not intended to replace advice given to you by your health care provider. Make sure you discuss any questions you have with your health care provider. ° °

## 2015-04-28 NOTE — Progress Notes (Signed)
Pt left 160 without receiving her D/C instructions and her copy of instructions.  MD notified.

## 2015-04-28 NOTE — Progress Notes (Signed)
IV D/C.  Tip intact.  Informed pt is to be discharged home.  [facility] Will print off D/C instructions for pt.

## 2015-04-28 NOTE — Discharge Summary (Signed)
Antenatal Physician Discharge Summary  Patient ID: Tiffany Villegas MRN: [id number] DOB/AGE: 1991/08/31 23 y.o.  Admit date: 04/27/2015 Discharge date: 04/28/2015  Admission Diagnoses: ANTENATAL DISCHARGE SUMMARY   Discharge Diagnoses: threatened preterm labor  Prenatal Procedures: NST  Intrapartum Procedures: n/a  Significant Diagnostic Studies:  Results for orders placed or performed during the hospital encounter of 04/27/15 (from the past 168 hour(s))  OB RESULT CONSOLE Group B Strep   Collection Time: 04/27/15 12:00 AM  Result Value Ref Range   GBS Negative   Urinalysis, Routine w reflex microscopic (not at [facility])   Collection Time: 04/27/15  3:37 PM  Result Value Ref Range   Color, Urine YELLOW YELLOW   APPearance CLEAR CLEAR   Specific Gravity, Urine 1.015 1.005 - 1.030   pH 6.5 5.0 - 8.0   Glucose, UA NEGATIVE NEGATIVE mg/dL   Hgb urine dipstick NEGATIVE NEGATIVE   Bilirubin Urine NEGATIVE NEGATIVE   Ketones, ur NEGATIVE NEGATIVE mg/dL   Protein, ur NEGATIVE NEGATIVE mg/dL   Urobilinogen, UA 0.2 0.0 - 1.0 mg/dL   Nitrite NEGATIVE NEGATIVE   Leukocytes, UA SMALL (A) NEGATIVE  Urine microscopic-add on   Collection Time: 04/27/15  3:37 PM  Result Value Ref Range   Squamous Epithelial / LPF MANY (A) RARE   WBC, UA 3-6 <3 WBC/hpf   RBC / HPF 3-6 <3 RBC/hpf   Bacteria, UA FEW (A) RARE  CBC   Collection Time: 04/27/15  8:05 PM  Result Value Ref Range   WBC 7.3 4.0 - 10.5 K/uL   RBC 3.91 3.87 - 5.11 MIL/uL   Hemoglobin 11.4 (L) 12.0 - 15.0 g/dL   HCT 11.9 (L) 14.7 - 82.9 %   MCV 89.0 78.0 - 100.0 fL   MCH 29.2 26.0 - 34.0 pg   MCHC 32.8 30.0 - 36.0 g/dL   RDW 56.2 13.0 - 86.5 %   Platelets 214 150 - 400 K/uL  RPR   Collection Time: 04/27/15  8:05 PM  Result Value Ref Range   RPR Ser Ql Non Reactive Non Reactive  Group B strep by PCR   Collection Time: 04/27/15  8:10 PM  Result Value Ref Range   Group B strep by PCR NEGATIVE NEGATIVE    Treatments:  betamethasone x1  Hospital Course:  This is a 24 y.o. [id number] with IUP at [redacted]w[redacted]d admitted for threatened preterm labor and possible fetal cardiac arrhythmia. The arrhythmia was noticed at her outpatient provider; extended fetal heart rate monitoring in our triage was normal and no further intervention was performed. The patient complained of intermittent contractions throughout her hospital stay but her cervix essentially did not change from 4 cm dilation. She received betamethasone x1.  She was deemed stable for discharge to home with outpatient follow up.  Discharge Exam: BP 110/54 mmHg  Pulse 92  Temp(Src) 97.9 F (36.6 C) (Oral)  Resp 18  Ht  (1.6 m)  Wt 136 lb 6.4 oz (61.871 kg)  BMI 24.17 kg/m2  LMP 08/22/2014 NAD NOrmocaphelic RRR CTAB Gravid, non-tender 4/thick/ballotable  Discharge Condition: stable  Disposition: 01-Home or Self Care  Discharge Instructions    Discharge activity:  No Restrictions    Complete by:  As directed      Discharge diet:  No restrictions    Complete by:  As directed      Notify physician for a general feeling that "something is not right"    Complete by:  As directed      Notify  physician for increase or change in vaginal discharge    Complete by:  As directed      Notify physician for intestinal cramps, with or without diarrhea, sometimes described as "gas pain"    Complete by:  As directed      Notify physician for leaking of fluid    Complete by:  As directed      Notify physician for low, dull backache, unrelieved by heat or Tylenol    Complete by:  As directed      Notify physician for menstrual like cramps    Complete by:  As directed      Notify physician for pelvic pressure    Complete by:  As directed      Notify physician for uterine contractions.  These may be painless and feel like the uterus is tightening or the baby is  "balling up"    Complete by:  As directed      Notify physician for vaginal bleeding    Complete by:   As directed      PRETERM LABOR:  Includes any of the follwing symptoms that occur between 20 - [redacted] weeks gestation.  If these symptoms are not stopped, preterm labor can result in preterm delivery, placing your baby at risk    Complete by:  As directed             Medication List    TAKE these medications        calcium carbonate 500 MG chewable tablet  Commonly known as:  TUMS - dosed in mg elemental calcium  Chew 1-2 tablets by mouth at bedtime as needed for indigestion or heartburn.     cephALEXin 500 MG capsule  Commonly known as:  KEFLEX  Take 1 capsule (500 mg total) by mouth 4 (four) times daily.     prenatal multivitamin Tabs tablet  Take 1 tablet by mouth daily at 12 noon.     ranitidine 150 MG capsule  Commonly known as:  ZANTAC  Take 1 capsule (150 mg total) by mouth daily.     terconazole 0.4 % vaginal cream  Commonly known as:  TERAZOL 7  Place 1 applicator vaginally at bedtime. Use for 7 days.           Follow-up Information    Follow up with [facility] [place].   Why:  Keep appt on the 8/8   Contact information:   [address] [place] [address] [phone]      Signed: Silvano Bilis M.D. 04/28/2015, 9:51 AM

## 2015-04-29 LAB — CULTURE, OB URINE

## 2015-04-30 ENCOUNTER — Encounter (HOSPITAL_COMMUNITY): Payer: Self-pay | Admitting: *Deleted

## 2015-04-30 ENCOUNTER — Inpatient Hospital Stay (HOSPITAL_COMMUNITY)
Admission: AD | Admit: 2015-04-30 | Discharge: 2015-05-03 | DRG: 775 | Disposition: A | Discharge status: Home / Self Care | Payer: Medicaid Other | Source: Ambulatory Visit | Attending: Obstetrics & Gynecology | Admitting: Obstetrics & Gynecology

## 2015-04-30 DIAGNOSIS — F17210 Nicotine dependence, cigarettes, uncomplicated: Secondary | ICD-10-CM | POA: Diagnosis present

## 2015-04-30 DIAGNOSIS — Z3A35 35 weeks gestation of pregnancy: Secondary | ICD-10-CM | POA: Diagnosis present

## 2015-04-30 DIAGNOSIS — O99334 Smoking (tobacco) complicating childbirth: Secondary | ICD-10-CM | POA: Diagnosis present

## 2015-04-30 DIAGNOSIS — Z8249 Family history of ischemic heart disease and other diseases of the circulatory system: Secondary | ICD-10-CM | POA: Diagnosis not present

## 2015-04-30 DIAGNOSIS — O99344 Other mental disorders complicating childbirth: Secondary | ICD-10-CM | POA: Diagnosis present

## 2015-04-30 DIAGNOSIS — O42919 Preterm premature rupture of membranes, unspecified as to length of time between rupture and onset of labor, unspecified trimester: Secondary | ICD-10-CM | POA: Diagnosis present

## 2015-04-30 DIAGNOSIS — O42913 Preterm premature rupture of membranes, unspecified as to length of time between rupture and onset of labor, third trimester: Principal | ICD-10-CM | POA: Diagnosis present

## 2015-04-30 HISTORY — DX: Major depressive disorder, single episode, unspecified: F32.9

## 2015-04-30 LAB — AMNISURE RUPTURE OF MEMBRANE (ROM) NOT AT ARMC: Amnisure ROM: POSITIVE

## 2015-04-30 LAB — CBC
HEMATOCRIT: 32.5 % — AB (ref 36.0–46.0)
Hemoglobin: 10.8 g/dL — ABNORMAL LOW (ref 12.0–15.0)
MCH: 29.5 pg (ref 26.0–34.0)
MCHC: 33.2 g/dL (ref 30.0–36.0)
MCV: 88.8 fL (ref 78.0–100.0)
Platelets: 208 10*3/uL (ref 150–400)
RBC: 3.66 MIL/uL — ABNORMAL LOW (ref 3.87–5.11)
RDW: 13.1 % (ref 11.5–15.5)
WBC: 6.1 10*3/uL (ref 4.0–10.5)

## 2015-04-30 MED ORDER — OXYTOCIN 40 UNITS IN LACTATED RINGERS INFUSION - SIMPLE MED
62.5000 mL/h | INTRAVENOUS | Status: DC
  Filled 2015-04-30: qty 1000

## 2015-04-30 MED ORDER — OXYTOCIN 40 UNITS IN LACTATED RINGERS INFUSION - SIMPLE MED
1.0000 m[IU]/min | INTRAVENOUS | Status: DC
  Administered 2015-05-01: 2 m[IU]/min via INTRAVENOUS
  Filled 2015-04-30: qty 1000

## 2015-04-30 MED ORDER — CITRIC ACID-SODIUM CITRATE 334-500 MG/5ML PO SOLN
30.0000 mL | ORAL | Status: DC | PRN
  Administered 2015-05-01: 30 mL via ORAL
  Filled 2015-04-30: qty 15

## 2015-04-30 MED ORDER — TERBUTALINE SULFATE 1 MG/ML IJ SOLN: 0.2500 mg | Freq: Once | INTRAMUSCULAR | Status: AC | PRN

## 2015-04-30 MED ORDER — ONDANSETRON HCL 4 MG/2ML IJ SOLN: 4.0000 mg | Freq: Four times a day (QID) | INTRAMUSCULAR | Status: DC | PRN

## 2015-04-30 MED ORDER — ACETAMINOPHEN 325 MG PO TABS
650.0000 mg | ORAL_TABLET | ORAL | Status: DC | PRN
Start: 2015-04-30 — End: 2015-05-02

## 2015-04-30 MED ORDER — LACTATED RINGERS IV SOLN: 500.0000 mL | INTRAVENOUS | Status: DC | PRN

## 2015-04-30 MED ORDER — LACTATED RINGERS IV SOLN
INTRAVENOUS | Status: DC
  Administered 2015-04-30 – 2015-05-01 (×3): via INTRAVENOUS

## 2015-04-30 MED ORDER — MISOPROSTOL 25 MCG QUARTER TABLET
25.0000 ug | ORAL_TABLET | ORAL | Status: DC | PRN
  Filled 2015-04-30: qty 1

## 2015-04-30 MED ORDER — OXYTOCIN BOLUS FROM INFUSION
500.0000 mL | INTRAVENOUS | Status: DC
  Administered 2015-05-01: 500 mL via INTRAVENOUS

## 2015-04-30 MED ORDER — OXYCODONE-ACETAMINOPHEN 5-325 MG PO TABS: 2.0000 | ORAL_TABLET | ORAL | Status: DC | PRN

## 2015-04-30 MED ORDER — ZOLPIDEM TARTRATE 5 MG PO TABS: 5.0000 mg | ORAL_TABLET | Freq: Every evening | ORAL | Status: DC | PRN

## 2015-04-30 MED ORDER — OXYCODONE-ACETAMINOPHEN 5-325 MG PO TABS: 1.0000 | ORAL_TABLET | ORAL | Status: DC | PRN

## 2015-04-30 MED ORDER — BUTORPHANOL TARTRATE 1 MG/ML IJ SOLN
1.0000 mg | INTRAMUSCULAR | Status: DC | PRN
Start: 2015-04-30 — End: 2015-05-02
  Administered 2015-05-01 (×2): 1 mg via INTRAVENOUS
  Filled 2015-04-30 (×2): qty 1

## 2015-04-30 MED ORDER — BETAMETHASONE SOD PHOS & ACET 6 (3-3) MG/ML IJ SUSP
12.0000 mg | Freq: Once | INTRAMUSCULAR | Status: AC
  Administered 2015-04-30: 12 mg via INTRAMUSCULAR
  Filled 2015-04-30: qty 2

## 2015-04-30 MED ORDER — OXYTOCIN 40 UNITS IN LACTATED RINGERS INFUSION - SIMPLE MED: 1.0000 m[IU]/min | INTRAVENOUS | Status: DC

## 2015-04-30 NOTE — H&P (Addendum)
Obstetric History and Physical   Chief Complaint: Leaking of fluid   HPI: Tiffany Villegas is a 24 y.o. [id number] at [redacted]w[redacted]d pt of [facility] who presents to maternity admissions reporting leakage of fluid described as clear, odorless, and with onset at 4:30 pm today, about 5 hours ago. She reports she soaked her underwear once, then has had some light spots in her underwear since that time but has not required a pad. She was admitted to the hospital with fetal arrhythmia and threatened preterm labor on 8/2 and was discharged on 8/3 with her cervix essentially unchanged from 4-5 cm during her hospital stay. She reports cramping off and on since she was in the hospital but they did become stronger and closer together after the leaking of fluid started. She reports good fetal movement, denies vaginal bleeding, vaginal itching/burning, urinary symptoms, h/a, dizziness, n/v, or fever/chills.   HPI  Past Medical History: Past Medical History  Diagnosis Date  . Chlamydia 2014  . Trichomoniasis   . DEPRESSION, MAJOR 10/12/2009    Qualifier: Diagnosis of By: Delrae Alfred MD, Lanora Manis     Past obstetric history: OB History  Gravida Para Term Preterm AB SAB TAB Ectopic Multiple Living  0 1 0 1 0 0 2    # Outcome Date GA Lbr Len/2nd Weight Sex Delivery Anes PTL Lv  4 Current           3 Term 09/09/13 [redacted]w[redacted]d 08:00 / 00:20 2.665 kg (5 lb 14 oz) M Vag-Spont EPI  Y  2 Term 12/20/07 [redacted]w[redacted]d  3.033 kg (6 lb 11 oz) M Vag-Spont EPI  Y   Comments: No complications  1 TAB               Past Surgical History: Past Surgical History  Procedure Laterality Date  . Induced abortion      Family History: Family History  Problem Relation Age of Onset  . Hypertension Mother     Social History: History  Substance Use Topics  . Smoking status: Current Every Day Smoker -- 0.25 packs/day     Types: Cigarettes  . Smokeless tobacco: Never Used  . Alcohol Use: No     Comment: occasional    Allergies: No Known Allergies  Meds:  Prescriptions prior to admission  Medication Sig Dispense Refill Last Dose  . calcium carbonate (TUMS - DOSED IN MG ELEMENTAL CALCIUM) 500 MG chewable tablet Chew 1-2 tablets by mouth at bedtime as needed for indigestion or heartburn.   04/29/2015 at Unknown time  . Prenatal Vit-Fe Fumarate-FA (PRENATAL MULTIVITAMIN) TABS tablet Take 1 tablet by mouth daily at 12 noon.   04/29/2015 at Unknown time  . cephALEXin (KEFLEX) 500 MG capsule Take 1 capsule (500 mg total) by mouth 4 (four) times daily. (Patient not taking: Reported on 04/27/2015) 28 capsule 0 Not Taking at Unknown time  . ranitidine (ZANTAC) 150 MG capsule Take 1 capsule (150 mg total) by mouth daily. (Patient not taking: Reported on 08/13/2014) 30 capsule 0 Not Taking at Unknown time  . terconazole (TERAZOL 7) 0.4 % vaginal cream Place 1 applicator vaginally at bedtime. Use for 7 days. (Patient not taking: Reported on 04/27/2015) 45 g 0 Completed Course at 04/26/15    Review of Systems  Constitutional: Negative for fever, chills and malaise/fatigue.  Eyes: Negative for blurred vision.  Respiratory: Negative for cough and shortness of breath.  Cardiovascular: Negative for chest pain.  Gastrointestinal: Positive for abdominal pain. Negative for heartburn, nausea  and vomiting.  Genitourinary: Negative for dysuria, urgency and frequency.  Musculoskeletal: Negative.  Neurological: Negative for dizziness and headaches.  Psychiatric/Behavioral: Negative for depression.    Physical Exam  Blood pressure 110/67, pulse 106, temperature 98.4 F (36.9 C), temperature source Oral, resp. rate 20, height 5\' 2"  (1.575 m), weight 61.916 kg (136 lb 8 oz), last menstrual period 08/22/2014, not currently breastfeeding. GENERAL: Well-developed, well-nourished  female in no acute distress.  EYES: normal sclera/conjunctiva; no lid-lag HENT: Atraumatic, normocephalic HEART: normal rate RESP: normal effort ABDOMEN: Soft, non-tender MUSCULOSKELETAL: Normal ROM EXTREMITIES: Nontender, no edema NEURO/PSYCH: Alert and oriented, appropriate affect  GU: Speculum exam with negative pooling of fluid, small amount thin white discharge. Amnisure collected.  Dilation: 5 Effacement (%): Thick Exam by:: LISA, CNM  FHT: Baseline 145 , moderate variability, accelerations 10x10, no 15x15 accels, no decelerations Contractions: q 2-4 mins, mild to palpation   Prenatal Labs  Blood type: AB/Positive/-- (03/14 0000)   Antibody:Negative (03/14 0000)  Rubella: Immune (03/14 0000)  RPR: Non Reactive (08/02 2005)   HBsAg: Negative (03/14 0000)   HIV: Non-reactive (03/14 0000)   ZOX:[id number] (08/02 0000)  Pap: normal  Quad: Negative  1 hr GTT: 95    Assessment: Preterm premature rupture of membranes (PPROM) at [redacted]w[redacted]d    Plan: Consulted with Dr. Ruben Gottron (neonatology) andhe informed me that NICU can accomdoate infant if needed in the time of high census. Admit to [facility] I[facility] for PPROM at [redacted]w[redacted]d Pitocin per protocol Already received betamethasone on 8/2 and 8/3. Category I FHR tracing. GBS negative Routine intrapartum care.  Hurshel Party, CNM   Attestation of Attending Supervision of Advanced Practitioner (PA/CNM/NP): Evaluation and management procedures were performed by the Advanced Practitioner under my supervision and collaboration.  I have reviewed the Advanced Practitioner's note and chart, and I agree with the management and plan, and made amendments as needed.  Jaynie Collins, MD, FACOG Attending Obstetrician & Gynecologist Faculty Practice, [facility]

## 2015-04-30 NOTE — MAU Provider Note (Signed)
Chief Complaint:  No chief complaint on file.   First Provider Initiated Contact with Patient 04/30/15 2122      HPI: Tiffany Villegas is a 24 y.o. [id number] at [redacted]w[redacted]d pt of [facility] who presents to maternity admissions reporting leakage of fluid described as clear, odorless, and with onset at 4:30 pm today, about 5 hours ago.  She reports she soaked her underwear once, then has had some light spots in her underwear since that time but has not required a pad.  She was admitted to the hospital with fetal arrhythmia and threatened preterm labor on 8/2 and was discharged on 8/3 with her cervix essentially unchanged from 4-5 cm during her hospital stay. She reports cramping off and on since she was in the hospital but they did become stronger and closer together after the leaking of fluid started. She reports good fetal movement, denies vaginal bleeding, vaginal itching/burning, urinary symptoms, h/a, dizziness, n/v, or fever/chills.    HPI  Past Medical History: Past Medical History  Diagnosis Date  . Chlamydia 2014  . Trichomoniasis   . DEPRESSION, MAJOR 10/12/2009    Qualifier: Diagnosis of  By: Delrae Alfred MD, Lanora Manis      Past obstetric history: OB History  Gravida Para Term Preterm AB SAB TAB Ectopic Multiple Living  0 1 0 1 0 0 2    # Outcome Date GA Lbr Len/2nd Weight Sex Delivery Anes PTL Lv  4 Current           3 Term 09/09/13 [redacted]w[redacted]d 08:00 / 00:20 2.665 kg (5 lb 14 oz) M Vag-Spont EPI  Y  2 Term 12/20/07 [redacted]w[redacted]d  3.033 kg (6 lb 11 oz) M Vag-Spont EPI  Y     Comments: No complications  1 TAB               Past Surgical History: Past Surgical History  Procedure Laterality Date  . Induced abortion      Family History: Family History  Problem Relation Age of Onset  . Hypertension Mother     Social History: History  Substance Use Topics  . Smoking status: Current Every Day Smoker -- 0.25 packs/day    Types: Cigarettes  . Smokeless tobacco: Never Used  . Alcohol Use: No      Comment: occasional    Allergies: No Known Allergies  Meds:  Prescriptions prior to admission  Medication Sig Dispense Refill Last Dose  . calcium carbonate (TUMS - DOSED IN MG ELEMENTAL CALCIUM) 500 MG chewable tablet Chew 1-2 tablets by mouth at bedtime as needed for indigestion or heartburn.   04/29/2015 at Unknown time  . Prenatal Vit-Fe Fumarate-FA (PRENATAL MULTIVITAMIN) TABS tablet Take 1 tablet by mouth daily at 12 noon.   04/29/2015 at Unknown time  . cephALEXin (KEFLEX) 500 MG capsule Take 1 capsule (500 mg total) by mouth 4 (four) times daily. (Patient not taking: Reported on 04/27/2015) 28 capsule 0 Not Taking at Unknown time  . ranitidine (ZANTAC) 150 MG capsule Take 1 capsule (150 mg total) by mouth daily. (Patient not taking: Reported on 08/13/2014) 30 capsule 0 Not Taking at Unknown time  . terconazole (TERAZOL 7) 0.4 % vaginal cream Place 1 applicator vaginally at bedtime. Use for 7 days. (Patient not taking: Reported on 04/27/2015) 45 g 0 Completed Course at 04/26/15    Review of Systems  Constitutional: Negative for fever, chills and malaise/fatigue.  Eyes: Negative for blurred vision.  Respiratory: Negative for cough and shortness of  breath.   Cardiovascular: Negative for chest pain.  Gastrointestinal: Positive for abdominal pain. Negative for heartburn, nausea and vomiting.  Genitourinary: Negative for dysuria, urgency and frequency.  Musculoskeletal: Negative.   Neurological: Negative for dizziness and headaches.  Psychiatric/Behavioral: Negative for depression.    Physical Exam  Blood pressure 110/67, pulse 106, temperature 98.4 F (36.9 C), temperature source Oral, resp. rate 20, height 5\' 2"  (1.575 m), weight 61.916 kg (136 lb 8 oz), last menstrual period 08/22/2014, not currently breastfeeding. GENERAL: Well-developed, well-nourished female in no acute distress.  EYES: normal sclera/conjunctiva; no lid-lag HENT: Atraumatic, normocephalic HEART: normal rate RESP:  normal effort ABDOMEN: Soft, non-tender MUSCULOSKELETAL: Normal ROM EXTREMITIES: Nontender, no edema NEURO/PSYCH: Alert and oriented, appropriate affect  GU: Speculum exam with negative pooling of fluid, small amount thin white discharge.  Amnisure collected.  Dilation: 5 Effacement (%): Thick Exam by:: Xzavior Reinig, CNM  FHT:  Baseline 145 , moderate variability, accelerations 10x10, no 15x15 accels, no decelerations Contractions: q 2-4 mins, mild to palpation   Labs: Results for orders placed or performed during the hospital encounter of 04/30/15 (from the past 24 hour(s))  Amnisure rupture of membrane (rom)not at [facility]     Status: None   Collection Time: 04/30/15  9:25 PM  Result Value Ref Range   Amnisure ROM POSITIVE    AB/Positive/-- (03/14 0000)  Assessment: 1. Preterm premature rupture of membranes (PPROM) at [redacted]w[redacted]d   2. GBS negative  Plan: Consult Dr Macon Large Admit to [facility] I[facility] for PPROM at [redacted]w[redacted]d      Medication List    ASK your doctor about these medications        calcium carbonate 500 MG chewable tablet  Commonly known as:  TUMS - dosed in mg elemental calcium  Chew 1-2 tablets by mouth at bedtime as needed for indigestion or heartburn.     cephALEXin 500 MG capsule  Commonly known as:  KEFLEX  Take 1 capsule (500 mg total) by mouth 4 (four) times daily.     prenatal multivitamin Tabs tablet  Take 1 tablet by mouth daily at 12 noon.     ranitidine 150 MG capsule  Commonly known as:  ZANTAC  Take 1 capsule (150 mg total) by mouth daily.     terconazole 0.4 % vaginal cream  Commonly known as:  TERAZOL 7  Place 1 applicator vaginally at bedtime. Use for 7 days.        Sharen Counter Certified Nurse-Midwife 04/30/2015 10:11 PM

## 2015-04-30 NOTE — MAU Note (Signed)
Dana RN in [facility] notified of pt's admission and status. Pt comfortable currently. Will call back when pt can transfer to [facility].

## 2015-04-30 NOTE — H&P (Deleted)
Tiffany Villegas is a 24 y.o. female presenting for leakage of fluid described as clear, odorless, and with onset at 4:30 pm today, about 5 hours ago. She reports she soaked her underwear once, then has had some light spots in her underwear since that time but has not required a pad. She was admitted to the hospital with fetal arrhythmia and threatened preterm labor on 8/2 and was discharged on 8/3 with her cervix essentially unchanged from 4-5 cm during her hospital stay. She reports cramping off and on since she was in the hospital but they did become stronger and closer together after the leaking of fluid started. She reports good fetal movement, denies vaginal bleeding, vaginal itching/burning, urinary symptoms, h/a, dizziness, n/v, or fever/chills.. Maternal Medical History:  Reason for admission: Rupture of membranes and contractions.  Nausea.  Contractions: Onset was 3-5 hours ago.   Frequency: irregular.   Perceived severity is moderate.    Fetal activity: Perceived fetal activity is normal.    Prenatal complications: No bleeding.   Prenatal Complications - Diabetes: none.    OB History    Gravida Para Term Preterm AB TAB SAB Ectopic Multiple Living   4 2 2  0 1 1 0 0 0 2     Past Medical History  Diagnosis Date  . Chlamydia 2014  . Trichomoniasis    Past Surgical History  Procedure Laterality Date  . Induced abortion     Family History: family history includes Hypertension in her mother. Social History:  reports that she has been smoking Cigarettes.  She has been smoking about 0.25 packs per day. She has never used smokeless tobacco. She reports that she does not drink alcohol or use illicit drugs.   Prenatal Transfer Tool  Maternal Diabetes: No Genetic Screening: Normal Maternal Ultrasounds/Referrals: Normal Fetal Ultrasounds or other Referrals:  None Maternal Substance Abuse:  No Significant Maternal Medications:  None Significant Maternal Lab Results:   None Other Comments:  Baby w/ arrhythmia  Review of Systems  Respiratory: Negative for shortness of breath.   Cardiovascular: Negative for chest pain and leg swelling.  Gastrointestinal: Negative for nausea, vomiting, diarrhea and constipation.  Genitourinary: Negative for urgency and frequency.  Neurological: Negative for weakness and headaches.    Dilation: 5 Effacement (%): Thick Exam by:: LISA, CNM Blood pressure 110/67, pulse 106, temperature 98.4 F (36.9 C), temperature source Oral, resp. rate 20, height 5\' 2"  (1.575 m), weight 61.916 kg (136 lb 8 oz), last menstrual period 08/22/2014, not currently breastfeeding. Maternal Exam:  Uterine Assessment: Contraction strength is moderate.  Contraction frequency is irregular.   Abdomen: Patient reports no abdominal tenderness. Introitus: Ferning test: negative.  Nitrazine test: negative. Amniotic fluid character: clear.     Fetal Exam Fetal Monitor Review: Baseline rate: 130s.  Variability: moderate (6-25 bpm).   Pattern: no accelerations and no decelerations.       Physical Exam  Constitutional: She is oriented to person, place, and time. She appears well-developed and well-nourished. No distress.  HENT:  Head: Normocephalic and atraumatic.  Eyes: Conjunctivae and EOM are normal.  Neck: No tracheal deviation present.  Cardiovascular: Normal rate, regular rhythm and normal heart sounds.   Respiratory: Effort normal and breath sounds normal. No respiratory distress. She has no wheezes. She has no rales. She exhibits no tenderness.  GI: Soft. There is no tenderness. There is no rebound and no guarding.  Musculoskeletal: Normal range of motion.  Neurological: She is alert and oriented to person, place, and  time.  Skin: Skin is warm and dry. She is not diaphoretic.  Psychiatric: She has a normal mood and affect. Her behavior is normal. Judgment and thought content normal.    Prenatal labs: ABO, Rh: AB/Positive/-- (03/14  0000) Antibody: Negative (03/14 0000) Rubella: Immune (03/14 0000) RPR: Non Reactive (08/02 2005)  HBsAg: Negative (03/14 0000)  HIV: Non-reactive (03/14 0000)  GBS: Negative (08/02 0000)   Assessment/Plan: Pt is a 24 yo f G4P2012 at [redacted]w[redacted]d who presents to the MAU w/ complaints of LOF and contx.  SOL #pt cervix is currently at 5cm and thick; no ferning but amnisure positive; having irregular contx q5-6 min #will admit to L&D w/ normal labor orders #pt is preterm and received dexamethasone x1; will order 2nd dose now #fetus has a known arrhythmia #will confirm cephalic presentation w/ [place] on the floor and then begin pit 2x2 #GBS neg, no ppx   Lowanda Foster 04/30/2015, 10:00 PM

## 2015-04-30 NOTE — MAU Note (Signed)
PT SAYS SHE WAS D/C  FROM ANTE  ON WED-   DILATED   TO 5 CM.    THINKS   AT 430PM-   WHILE  SHE WAS    WENT  TO B-ROOM -  HER UNDERWEAR  WERE  SOAKED  WITH   CLEAR FLUID.     ON ARRIVAL  - NO  WETNESS ON PERINEUM.     THEN AFTERWARDS - UC  STARTED   AGAIN.       DENIES HSV AND MRSA.   GBS-  NEG.

## 2015-05-01 ENCOUNTER — Inpatient Hospital Stay (HOSPITAL_COMMUNITY): Payer: Medicaid Other | Admitting: Anesthesiology

## 2015-05-01 LAB — RPR: RPR: NONREACTIVE

## 2015-05-01 MED ORDER — EPHEDRINE 5 MG/ML INJ
10.0000 mg | INTRAVENOUS | Status: DC | PRN
  Filled 2015-05-01: qty 2

## 2015-05-01 MED ORDER — DIPHENHYDRAMINE HCL 50 MG/ML IJ SOLN: 12.5000 mg | INTRAMUSCULAR | Status: DC | PRN

## 2015-05-01 MED ORDER — PHENYLEPHRINE 40 MCG/ML (10ML) SYRINGE FOR IV PUSH (FOR BLOOD PRESSURE SUPPORT)
80.0000 ug | PREFILLED_SYRINGE | INTRAVENOUS | Status: DC | PRN
  Filled 2015-05-01: qty 2

## 2015-05-01 MED ORDER — FENTANYL 2.5 MCG/ML BUPIVACAINE 1/10 % EPIDURAL INFUSION (WH - ANES)
14.0000 mL/h | INTRAMUSCULAR | Status: DC | PRN
  Administered 2015-05-01 (×4): 14 mL/h via EPIDURAL
  Filled 2015-05-01 (×4): qty 125

## 2015-05-01 MED ORDER — LIDOCAINE HCL (PF) 1 % IJ SOLN
INTRAMUSCULAR | Status: DC | PRN
  Administered 2015-05-01: 5 mL
  Administered 2015-05-01: 3 mL
  Administered 2015-05-01: 5 mL

## 2015-05-01 MED ORDER — LIDOCAINE HCL (PF) 1 % IJ SOLN
INTRAMUSCULAR | Status: AC
  Filled 2015-05-01: qty 30

## 2015-05-01 MED ORDER — OXYTOCIN 40 UNITS IN LACTATED RINGERS INFUSION - SIMPLE MED
1.0000 m[IU]/min | INTRAVENOUS | Status: DC
  Administered 2015-05-01: 2 m[IU]/min via INTRAVENOUS

## 2015-05-01 NOTE — Anesthesia Procedure Notes (Signed)
Epidural Patient location during procedure: OB  Staffing Anesthesiologist: Han Lysne Performed by: anesthesiologist   Preanesthetic Checklist Completed: patient identified, site marked, surgical consent, pre-op evaluation, timeout performed, IV checked, risks and benefits discussed and monitors and equipment checked  Epidural Patient position: sitting Prep: DuraPrep Patient monitoring: heart rate, continuous pulse ox and blood pressure Approach: midline Location: L3-L4 Injection technique: LOR saline  Needle:  Needle type: Tuohy  Needle gauge: 17 G Needle length: 9 cm and 9 Needle insertion depth: 5 cm Catheter type: closed end flexible Catheter size: 20 Guage Catheter at skin depth: 9 cm Test dose: negative  Assessment Events: blood not aspirated, injection not painful, no injection resistance, negative IV test and no paresthesia  Additional Notes Patient identified. Risks/Benefits/Options discussed with patient including but not limited to bleeding, infection, nerve damage, paralysis, failed block, incomplete pain control, headache, blood pressure changes, nausea, vomiting, reactions to medication both or allergic, itching and postpartum back pain. Confirmed with bedside nurse the patient's most recent platelet count. Confirmed with patient that they are not currently taking any anticoagulation, have any bleeding history or any family history of bleeding disorders. Patient expressed understanding and wished to proceed. All questions were answered. Sterile technique was used throughout the entire procedure. Please see nursing notes for vital signs. Test dose was given through epidural needle and negative prior to continuing to dose epidural or start infusion. Warning signs of high block given to the patient including shortness of breath, tingling/numbness in hands, complete motor block, or any concerning symptoms with instructions to call for help. Patient was given  instructions on fall risk and not to get out of bed. All questions and concerns addressed with instructions to call with any issues.   

## 2015-05-01 NOTE — Progress Notes (Signed)
LABOR PROGRESS NOTE  Tiffany Villegas is a 24 y.o. [id number] at [redacted]w[redacted]d  admitted for PPROM.  Subjective: No fever or chills or abdominal pain or foul-smelling vaginal discharge. Very mild contractions s/p epidural.  Objective: BP 99/62 mmHg  Pulse 78  Temp(Src) 97.2 F (36.2 C) (Oral)  Resp 18  Ht  (1.575 m)  Wt 136 lb 8 oz (61.916 kg)  BMI 24.96 kg/m2  SpO2 100%  LMP 08/22/2014 or  Filed Vitals:   05/01/15 1329 05/01/15 1359 05/01/15 1429 05/01/15 1459  BP: 101/55 108/56 109/65 99/62  Pulse: 81 66 76 78  Temp:    97.2 F (36.2 C)  TempSrc:    Oral  Resp:    18  Height:      Weight:      SpO2:       Category 1 strip (130/mod/+a/-d)  Dilation: 6 Effacement (%): 70 Cervical Position: Middle Station: -2 Presentation: Vertex Exam by:: k fields, rn  Labs: Lab Results  Component Value Date   WBC 6.1 04/30/2015   HGB 10.8* 04/30/2015   HCT 32.5* 04/30/2015   MCV 88.8 04/30/2015   PLT 208 04/30/2015    Patient Active Problem List   Diagnosis Date Noted  . Preterm premature rupture of membranes (PPROM) at [redacted]w[redacted]d 04/30/2015  . Normal labor 04/30/2015  . Preterm labor 04/28/2015    Assessment / Plan: 24 y.o. [id number] at [redacted]w[redacted]d here for PPROM  Labor: now at 20 units/hr pitocin for ~4 hours w/o cervical change, will wash-out for 2 hours and re-start. PPROM at 16:30 on 8/5. No signs/symptoms chorioamnionitis  Fetal Wellbeing:  Category 1 Pain Control:  epidural Anticipated MOD:  vaginal  Silvano Bilis, MD 05/01/2015, 3:13 PM

## 2015-05-01 NOTE — Progress Notes (Signed)
Labor Progress Note  S: RN contacted MD stating that pt is feeling sig pressure; checked approx 2 hrs ago at time of FSE placement and found to be 5.5; pt shivering and feeling pressure but in no pain and w/o further complaints  O:  BP 127/94 mmHg  Pulse 91  Temp(Src) 97.9 F (36.6 C) (Axillary)  Resp 18  Ht  (1.575 m)  Wt 61.916 kg (136 lb 8 oz)  BMI 24.96 kg/m2  SpO2 100%  LMP 08/22/2014 FHR 130s, mod var, +acels, +early decels, contx q2-3 min CVE: Dilation: 8 Effacement (%): 90 Cervical Position: Middle Station: 0 Presentation: Vertex Exam by:: Freida Busman MD   A&P: 24 y.o. [id number] [redacted]w[redacted]d pt making sig progress #cont pit titration #IUPC if needed, though no need currently #expecting progression to delivery shortly  Lowanda Foster, MD 10:55 PM

## 2015-05-01 NOTE — Progress Notes (Signed)
Labor Progress Note  S: Pt seen w/o RN at bedside. She states that she is becoming more uncomfortable w/ contx and would like to receive her epidural now. She is otherwise w/o complaint.  O:  BP 121/82 mmHg  Pulse 80  Temp(Src) 98.6 F (37 C) (Oral)  Resp 18  Ht  (1.575 m)  Wt 61.916 kg (136 lb 8 oz)  BMI 24.96 kg/m2  LMP 08/22/2014 Cat I; FHR 120s, mod var, +acels, -decels, contx q3-5 min CVE:  Dilation: 5 Effacement (%): Thick Presentation: Vertex Exam by:: Freida Busman MD   A&P: 24 y.o. [id number] [redacted]w[redacted]d Now on pit w/ adequate contx. #cont pit titration #epidural ordered #expecting normal progression to VD  Lowanda Foster, MD 3:34 AM

## 2015-05-01 NOTE — Anesthesia Preprocedure Evaluation (Signed)
Anesthesia Evaluation  Patient identified by MRN, date of birth, ID band Patient awake    Reviewed: Allergy & Precautions, H&P , NPO status , Patient's Chart, lab work & pertinent test results  History of Anesthesia Complications Negative for: history of anesthetic complications  Airway Mallampati: II  TM Distance: >3 FB Neck ROM: full    Dental no notable dental hx. (+) Teeth Intact   Pulmonary neg pulmonary ROS, Current Smoker,  breath sounds clear to auscultation  Pulmonary exam normal       Cardiovascular negative cardio ROS Normal cardiovascular examRhythm:regular Rate:Normal     Neuro/Psych negative neurological ROS  negative psych ROS   GI/Hepatic negative GI ROS, Neg liver ROS,   Endo/Other  negative endocrine ROS  Renal/GU negative Renal ROS  negative genitourinary   Musculoskeletal   Abdominal   Peds  Hematology negative hematology ROS (+)   Anesthesia Other Findings   Reproductive/Obstetrics (+) Pregnancy                             Anesthesia Physical Anesthesia Plan  ASA: II  Anesthesia Plan: Epidural   Post-op Pain Management:    Induction:   Airway Management Planned:   Additional Equipment:   Intra-op Plan:   Post-operative Plan:   Informed Consent: I have reviewed the patients History and Physical, chart, labs and discussed the procedure including the risks, benefits and alternatives for the proposed anesthesia with the patient or authorized representative who has indicated his/her understanding and acceptance.     Plan Discussed with:   Anesthesia Plan Comments:         Anesthesia Quick Evaluation

## 2015-05-01 NOTE — Progress Notes (Signed)
Patient ID: Tiffany Villegas, female   DOB: 11-10-1990, 24 y.o.   MRN: [id number] Tiffany Villegas is a 24 y.o. [id number] at [redacted]w[redacted]d.  Subjective: Comfortable w/ epidural.  Objective: BP 117/64 mmHg  Pulse 79  Temp(Src) 98.3 F (36.8 C) (Oral)  Resp 18  Ht  (1.575 m)  Wt 136 lb 8 oz (61.916 kg)  BMI 24.96 kg/m2  SpO2 100%  LMP 08/22/2014   FHT:  FHR: 125 bpm, variability: min-mod,  accelerations:  10x10,  decelerations:  Few mild variables UC:   Q 2-6 minutes, moderate  Dilation: 6 Effacement (%): 70 Cervical Position: Middle Station: -2 Presentation: Vertex Exam by:: Foye Clock RN  Labs: Results for orders placed or performed during the hospital encounter of 04/30/15 (from the past 24 hour(s))  Amnisure rupture of membrane (rom)not at [facility]     Status: None   Collection Time: 04/30/15  9:25 PM  Result Value Ref Range   Amnisure ROM POSITIVE   CBC     Status: Abnormal   Collection Time: 04/30/15 10:10 PM  Result Value Ref Range   WBC 6.1 4.0 - 10.5 K/uL   RBC 3.66 (L) 3.87 - 5.11 MIL/uL   Hemoglobin 10.8 (L) 12.0 - 15.0 g/dL   HCT 14.7 (L) 82.9 - 56.2 %   MCV 88.8 78.0 - 100.0 fL   MCH 29.5 26.0 - 34.0 pg   MCHC 33.2 30.0 - 36.0 g/dL   RDW 13.0 86.5 - 78.4 %   Platelets 208 150 - 400 K/uL    Assessment / Plan: [redacted]w[redacted]d week IUP Labor: Active Fetal Wellbeing:  Category I-II Pain Control:  Epidural Anticipated MOD:  SVD  Tiffany Villegas, CNM 05/01/2015 7:15 AM

## 2015-05-02 ENCOUNTER — Encounter (HOSPITAL_COMMUNITY): Payer: Self-pay | Admitting: *Deleted

## 2015-05-02 DIAGNOSIS — F17210 Nicotine dependence, cigarettes, uncomplicated: Secondary | ICD-10-CM

## 2015-05-02 DIAGNOSIS — Z3A35 35 weeks gestation of pregnancy: Secondary | ICD-10-CM

## 2015-05-02 DIAGNOSIS — O99344 Other mental disorders complicating childbirth: Secondary | ICD-10-CM

## 2015-05-02 DIAGNOSIS — O42913 Preterm premature rupture of membranes, unspecified as to length of time between rupture and onset of labor, third trimester: Secondary | ICD-10-CM

## 2015-05-02 MED ORDER — ACETAMINOPHEN 325 MG PO TABS: 650.0000 mg | ORAL_TABLET | ORAL | Status: DC | PRN

## 2015-05-02 MED ORDER — OXYCODONE-ACETAMINOPHEN 5-325 MG PO TABS
1.0000 | ORAL_TABLET | ORAL | Status: DC | PRN
  Administered 2015-05-02 – 2015-05-03 (×5): 1 via ORAL
  Filled 2015-05-02 (×4): qty 1

## 2015-05-02 MED ORDER — DIPHENHYDRAMINE HCL 25 MG PO CAPS: 25.0000 mg | ORAL_CAPSULE | Freq: Four times a day (QID) | ORAL | Status: DC | PRN

## 2015-05-02 MED ORDER — OXYCODONE-ACETAMINOPHEN 5-325 MG PO TABS: 2.0000 | ORAL_TABLET | ORAL | Status: DC | PRN

## 2015-05-02 MED ORDER — ONDANSETRON HCL 4 MG/2ML IJ SOLN: 4.0000 mg | INTRAMUSCULAR | Status: DC | PRN

## 2015-05-02 MED ORDER — DIBUCAINE 1 % RE OINT: 1.0000 "application " | TOPICAL_OINTMENT | RECTAL | Status: DC | PRN

## 2015-05-02 MED ORDER — TETANUS-DIPHTH-ACELL PERTUSSIS 5-2.5-18.5 LF-MCG/0.5 IM SUSP
0.5000 mL | Freq: Once | INTRAMUSCULAR | Status: AC
  Administered 2015-05-02: 0.5 mL via INTRAMUSCULAR

## 2015-05-02 MED ORDER — ONDANSETRON HCL 4 MG PO TABS
4.0000 mg | ORAL_TABLET | ORAL | Status: DC | PRN
  Administered 2015-05-03: 4 mg via ORAL
  Filled 2015-05-02: qty 1

## 2015-05-02 MED ORDER — OXYTOCIN 40 UNITS IN LACTATED RINGERS INFUSION - SIMPLE MED: 62.5000 mL/h | INTRAVENOUS | Status: DC | PRN

## 2015-05-02 MED ORDER — SENNOSIDES-DOCUSATE SODIUM 8.6-50 MG PO TABS
2.0000 | ORAL_TABLET | ORAL | Status: DC
  Administered 2015-05-02: 2 via ORAL
  Filled 2015-05-02: qty 2

## 2015-05-02 MED ORDER — PNEUMOCOCCAL VAC POLYVALENT 25 MCG/0.5ML IJ INJ
0.5000 mL | INJECTION | INTRAMUSCULAR | Status: AC
  Administered 2015-05-02: 0.5 mL via INTRAMUSCULAR
  Filled 2015-05-02: qty 0.5

## 2015-05-02 MED ORDER — IBUPROFEN 600 MG PO TABS
600.0000 mg | ORAL_TABLET | Freq: Four times a day (QID) | ORAL | Status: DC
  Administered 2015-05-02 – 2015-05-03 (×6): 600 mg via ORAL
  Filled 2015-05-02 (×6): qty 1

## 2015-05-02 MED ORDER — SIMETHICONE 80 MG PO CHEW: 80.0000 mg | CHEWABLE_TABLET | ORAL | Status: DC | PRN

## 2015-05-02 MED ORDER — MEASLES, MUMPS & RUBELLA VAC ~~LOC~~ INJ
0.5000 mL | INJECTION | Freq: Once | SUBCUTANEOUS | Status: DC
  Filled 2015-05-02: qty 0.5

## 2015-05-02 MED ORDER — BENZOCAINE-MENTHOL 20-0.5 % EX AERO: 1.0000 "application " | INHALATION_SPRAY | CUTANEOUS | Status: DC | PRN

## 2015-05-02 MED ORDER — PRENATAL MULTIVITAMIN CH
1.0000 | ORAL_TABLET | Freq: Every day | ORAL | Status: DC
  Administered 2015-05-02 – 2015-05-03 (×2): 1 via ORAL
  Filled 2015-05-02: qty 1

## 2015-05-02 MED ORDER — WITCH HAZEL-GLYCERIN EX PADS: 1.0000 "application " | MEDICATED_PAD | CUTANEOUS | Status: DC | PRN

## 2015-05-02 MED ORDER — LANOLIN HYDROUS EX OINT: TOPICAL_OINTMENT | CUTANEOUS | Status: DC | PRN

## 2015-05-02 MED ORDER — ZOLPIDEM TARTRATE 5 MG PO TABS: 5.0000 mg | ORAL_TABLET | Freq: Every evening | ORAL | Status: DC | PRN

## 2015-05-02 NOTE — Anesthesia Postprocedure Evaluation (Signed)
  Anesthesia Post-op Note  Patient: Tiffany Villegas  Procedure(s) Performed: * No procedures listed *  Patient Location: Mother/Baby  Anesthesia Type:Epidural  Level of Consciousness: awake and alert   Airway and Oxygen Therapy: Patient Spontanous Breathing  Post-op Pain: mild  Post-op Assessment: Post-op Vital signs reviewed, Patient's Cardiovascular Status Stable, Respiratory Function Stable, No signs of Nausea or vomiting, Pain level controlled, No headache, Spinal receding and Patient able to bend at knees              Post-op Vital Signs: Reviewed  Last Vitals:  Filed Vitals:   05/02/15 0630  BP: 115/78  Pulse: 72  Temp: 36.8 C  Resp: 18    Complications: No apparent anesthesia complications

## 2015-05-02 NOTE — Progress Notes (Signed)
Post Partum Day 1  Subjective:  Tiffany Villegas is a 24 y.o. [id number] [redacted]w[redacted]d s/p SVD of viable preterm female at 11:43pm last night.  No acute events overnight.  Pt denies problems with voiding or po intake. Notes that she initially had some difficulty standing, but has not had difficulty standing since epidural wore off.  No dizziness with standing.  She denies nausea or vomiting.  Pain is well controlled.  She has had flatus. She has not had bowel movement.  Lochia Small.  Plan for birth control is Depo-Provera.  Method of Feeding: Breast  Objective: Blood pressure 115/78, pulse 72, temperature 98.2 F (36.8 C), temperature source Oral, resp. rate 18, height  (1.575 m), weight 61.916 kg (136 lb 8 oz), last menstrual period 08/22/2014, SpO2 100 %, unknown if currently breastfeeding.  Physical Exam:  General: alert, cooperative and no distress, laying comfortably in bed attempting to breastfeed infant Lochia: normal flow Chest: CTAB Heart: RRR  Abdomen: +BS, soft, nontender Uterine Fundus: firm DVT Evaluation: No evidence of DVT seen on physical exam. Extremities: No edema   Recent Labs  04/30/15 2210  HGB 10.8*  HCT 32.5*    Assessment/Plan: ASSESSMENT: Tiffany Villegas is a 25 y.o. [id number] [redacted]w[redacted]d s/p SVD of viable female after PPROM.  Overall doing well on PPD#1 Breastfeeding, Lactation consult, Social Work consult and Contraception plan is Depo-Provera.  Plan to discharge home tomorrow or Tuesday.   LOS: 2 days   Retta Mac 05/02/2015, 10:11 AM   OB FELLOW POSTPARTUM PROGRESS NOTE ATTESTATION  Post Partum Day  I have seen and examined this patient and agree with above documentation in the medical student's note.   Tiffany Villegas is a 24 y.o. [phone] s/p nsvd.  Pt denies problems with ambulating, voiding or po intake. Pain is well controlled.    PE:  BP 103/54 mmHg  Pulse 73  Temp(Src) 98.4 F (36.9 C) (Oral)  Resp 18  Ht  (1.575 m)  Wt 136 lb  8 oz (61.916 kg)  BMI 24.96 kg/m2  SpO2 100%  LMP 08/22/2014  Breastfeeding? Unknown Fundus firm  Plan for discharge: tomorrow  Silvano Bilis, MD 8:32 AM

## 2015-05-02 NOTE — Lactation Note (Signed)
This note was copied from the chart of Tiffany Villegas. Lactation Consultation Note  Patient Name: Tiffany Villegas [name] Date: 05/02/2015 Reason for consult: Initial assessment;Late preterm infant;Infant < 6lbs  LPI 15 hours old. Mom and patient's RN report that baby does not have a coordinated suckle and it took baby 20 minutes to take 6 ml of EBM by bottle. Discussed with mom that baby should be put to breast first, and then supplemented with EBM, and that total feeding time for each feed should be kept to 30 minutes. Enc mom to call for assistance as needed with feeding, and to feed baby with cues but wait no longer than 3 hours to wake baby and feed if needed. Discussed normal LPI behavior, and discussed need to supplement for up to 40-week CGA. Reviewed LPI green sheet with mom. Plan is for mom to offer breast first, about 10 minutes for now until baby taking bottle better. Then supplement according to supplementation guidelines, and post-pump after each feeding to have EBM ready for the next feeding.   Discussed need of DEBP for home with mom. WIC BF assistance referral sent to [facility] office. Mom enc to speak with WIC the morning of 05-03-15 for an appointment. Mom aware of [facility] loaner pump as well. Discussed assessment, interventions, and feeding plan with patient's RN, Tiffany Villegas.  Maternal Data    Feeding Feeding Type: Bottle Fed - Formula Nipple Type: Slow - flow  LATCH Score/Interventions                      Lactation Tools Discussed/Used Pump Review: Setup, frequency, and cleaning Initiated by:: Bedside RN. Date initiated:: 05/01/15   Consult Status Consult Status: Follow-up Date: 05/03/15 Follow-up type: In-patient    Tiffany Villegas 05/02/2015, 3:20 PM

## 2015-05-02 NOTE — Lactation Note (Signed)
This note was copied from the chart of Tiffany Villegas. Lactation Consultation Note  Patient Name: Tiffany Villegas Today's Date: 05/02/2015  Baby, LPI, 12 hours old. Mom and baby sleeping soundly when this LC entered room. DEBP at bedside. Discussed patient with patient's RN, Tiffany Villegas, who states mom is getting good amount of colostrum when pumping, but baby not wanting to take bottle/supplement. Gould, RN states that patient was enc to call out for assistance at next feeding, and patient aware that baby should be put to breast and then supplemented.    Maternal Data    Feeding Feeding Type: Breast Fed Nipple Type: Slow - flow Length of feed: 0 min  LATCH Score/Interventions                      Lactation Tools Discussed/Used     Consult Status      Tiffany Villegas, Tiffany Villegas 05/02/2015, 12:08 PM

## 2015-05-02 NOTE — Progress Notes (Signed)
Acknowledged order for social work consult for history of depression.   MOB presented as surprised when CSW informed her of reason for consult.  She noted that she was diagnosed with depression as a teenager and treated with therapy and medication.  Informed that it has been over five years since she's been treated and she denies any recurring symptoms in 5 years.  She also denies any currently symptoms or depressive symptoms during pregnancy.  MOB also denied history of postpartum depression. She endorsed feeling well, and supported by family.  She denied any barriers to accessing mental health treatment if needs arise.   CSW did not complete full assessment since MOB stated that it was not needed.  Contact CSW if needs arise or upon MOB request.   

## 2015-05-03 MED ORDER — IBUPROFEN 600 MG PO TABS: 600.0000 mg | ORAL_TABLET | Freq: Four times a day (QID) | ORAL | Status: DC

## 2015-05-03 MED ORDER — ONDANSETRON HCL 4 MG PO TABS: 4.0000 mg | ORAL_TABLET | ORAL | Status: DC | PRN

## 2015-05-03 NOTE — Lactation Note (Signed)
This note was copied from the chart of Tiffany Villegas. Lactation Consultation Note  Mom has been breastfeeding baby on cue and states she is latching well.  Reviewed with mom the importance of pumping every 3 hours and increasing supplement today to 10-20 mls of expressed milk and or formula.  Mom has not pumped since yesterday so assisted with pumping for 15 minutes.  She obtained 10 mls which baby took by bottle.  I feel mom will need some assistance in remembering plan.  I will follow up later today.  Patient Name: Tiffany Villegas [name] Date: 05/03/2015 Reason for consult: Follow-up assessment;Infant < 6lbs;Late preterm infant   Maternal Data    Feeding Feeding Type: Bottle Fed - Breast Milk Nipple Type: Slow - flow Length of feed: 7 min  LATCH Score/Interventions                      Lactation Tools Discussed/Used     Consult Status Consult Status: Follow-up Date: 05/03/15    Huston Foley 05/03/2015, 10:17 AM

## 2015-05-03 NOTE — Discharge Summary (Signed)
Obstetric Discharge Summary Reason for Admission: onset of labor Prenatal Procedures: ultrasound Intrapartum Procedures: spontaneous vaginal delivery Postpartum Procedures: none Complications-Operative and Postpartum: none HEMOGLOBIN  Date Value Ref Range Status  04/30/2015 10.8* 12.0 - 15.0 g/dL Final   HCT  Date Value Ref Range Status  04/30/2015 32.5* 36.0 - 46.0 % Final    Physical Exam:  General: alert, cooperative, appears stated age and no distress Lochia: appropriate Uterine Fundus: firm Incision: n/a DVT Evaluation: No evidence of DVT seen on physical exam. Negative Homan's sign. No cords or calf tenderness.  Discharge Diagnoses: Term Pregnancy-delivered  Discharge Information: Date: 05/03/2015 Activity: pelvic rest Diet: routine Medications: PNV and Ibuprofen Condition: stable and improved Instructions: refer to practice specific booklet Discharge to: home   Newborn Data: Live born female  Birth Weight: 4 lb 12.9 oz (2180 g) APGAR: 8, 8  Home with mother.  Tiffany Villegas 05/03/2015, 6:29 AM

## 2015-05-04 ENCOUNTER — Ambulatory Visit: Payer: Self-pay

## 2015-05-04 LAB — TYPE AND SCREEN
ABO/RH(D): AB POS
ANTIBODY SCREEN: NEGATIVE
UNIT DIVISION: 0
Unit division: 0

## 2015-05-04 NOTE — Lactation Note (Signed)
This note was copied from the chart of Tiffany Kahliyah Barron. Lactation Consultation Note  Follow up consult due to RN request because of 8 yellow watery stools. LC not concerned at this time. Observed feeding and baby latches easily sucks and swallows observed for 15 min. Weight loss from last night was 5.5% down from 4.1%.  Bilirubin WNL. Baby has had 16 stools since birth that have transitioned in color and consistency. Had mother post pump for 15 min and she pumped 40 ml. Suggest she give it to baby with next feeding.  Reviewed milk storage. Encouraged her to continue to post pump 4-6 times a day for 10 -15 min. WIC has contacted mother and she will be getting a DEBP after discharge. Encoruaged mother to call if she has further concerns.  Patient Name: Tiffany Villegas [name] Date: 05/04/2015 Reason for consult: Follow-up assessment   Maternal Data    Feeding Feeding Type: Breast Fed Length of feed: 15 min  LATCH Score/Interventions Latch: Grasps breast easily, tongue down, lips flanged, rhythmical sucking.  Audible Swallowing: Spontaneous and intermittent Intervention(s): Hand expression  Type of Nipple: Everted at rest and after stimulation  Comfort (Breast/Nipple): Soft / non-tender     Hold (Positioning): No assistance needed to correctly position infant at breast.  LATCH Score: 10  Lactation Tools Discussed/Used     Consult Status Consult Status: Complete    Hardie Pulley 05/04/2015, 10:10 PM

## 2015-05-04 NOTE — Lactation Note (Signed)
This note was copied from the chart of Tiffany Mikeya Finkle. Lactation Consultation Note  LC entered at end of 20 min feeding. Baby latched in cross cradle.  Encouraged mother to massage breast to keep baby active. Reviewed late preterm feeding plan.  Encouraged mother to post pump at least 4-6 times a day and give baby back pumped breastmilk. Reminded her to wake baby after 3 hours if she does not cue and encouraged STS. Discussed WIC pump loaner. Mother states she gave her boyfriends's phone number to [facility] and they tried to call.  Gave LC correct phone number and faxed referral to [facility]. Mom encouraged to feed baby 8-12 times/24 hours and with feeding cues.  Reviewed volume guidelines LPI, engorgement care, milk storage, gave mother hand pump, and encouraged her to monitor voids/stools and keep hat on baby.  Patient Name: Tiffany Villegas Date: 05/04/2015 Reason for consult: Late preterm infant;Follow-up assessment   Maternal Data    Feeding Feeding Type: Breast Fed Nipple Type: Slow - flow Length of feed: 20 min  LATCH Score/Interventions Latch: Grasps breast easily, tongue down, lips flanged, rhythmical sucking.  Audible Swallowing: Spontaneous and intermittent  Type of Nipple: Everted at rest and after stimulation  Comfort (Breast/Nipple): Soft / non-tender     Hold (Positioning): No assistance needed to correctly position infant at breast.  LATCH Score: 10  Lactation Tools Discussed/Used     Consult Status Consult Status: Complete    Hardie Pulley 05/04/2015, 8:59 AM

## 2015-05-05 ENCOUNTER — Ambulatory Visit: Payer: Self-pay

## 2015-05-05 NOTE — Lactation Note (Addendum)
This note was copied from the chart of Tiffany Villegas. Lactation Consultation Note: Infant has a one ounce gain from yesterday.  Mother has been breastfeeding and giving 15 ml of formula with a bottle after feeding. assist mother with unwraping infant and proper postioning. Infant placed in cross cradle hold. Observed shallow latch. Mother taught proper latch with good depth. Observed good burst of suckling for 15 mins. Infant placed in football hold and sustained latch for 10 mins. Mother very receptive to teaching. She has an appt with WIC in [place] [facility] . She states she wants to get a pump and formula from [facility]. Mother was offered a [facility] Loaner pump. She declines stating that she will use her hand pump. Advised mother in benefits of using her own pumped milk. Mother states she has a 76 month old at home and she thinks it will be easier to breastfeed and use formula. Mother informed of available LC services . She states she lives in [place] and she would have a hard time getting back to [place]. Advised seeking LC support at [facility]. Mother advised to post pump after each feeding and supplement infant with suggested amts of formula or EBM after each feeding. Discussed behaviors of LPI and advised to rouse infant well for each feeding. Mother advised to keep accurate account of all wets and dirty diapers and follow up with all Peds visits.   Patient Name: Tiffany Villegas [name] Date: 05/05/2015 Reason for consult: Follow-up assessment   Maternal Data    Feeding Feeding Type: Breast Fed Length of feed: 10 min  LATCH Score/Interventions Latch: Grasps breast easily, tongue down, lips flanged, rhythmical sucking.  Audible Swallowing: Spontaneous and intermittent  Type of Nipple: Everted at rest and after stimulation  Comfort (Breast/Nipple): Filling, red/small blisters or bruises, mild/mod discomfort     Hold (Positioning): Assistance needed to correctly position infant at breast and  maintain latch. Intervention(s): Support Pillows;Position options  LATCH Score: 8  Lactation Tools Discussed/Used     Consult Status Consult Status: Follow-up    Stevan Born [facility] 05/05/2015, 12:26 PM

## 2016-02-11 ENCOUNTER — Encounter (HOSPITAL_COMMUNITY): Payer: Self-pay | Admitting: *Deleted

## 2016-02-11 ENCOUNTER — Inpatient Hospital Stay (HOSPITAL_COMMUNITY)
Admission: AD | Admit: 2016-02-11 | Discharge: 2016-02-11 | Disposition: A | Discharge status: Home / Self Care | Payer: Medicaid Other | Source: Ambulatory Visit | Attending: Obstetrics and Gynecology | Admitting: Obstetrics and Gynecology

## 2016-02-11 ENCOUNTER — Inpatient Hospital Stay (HOSPITAL_COMMUNITY): Payer: Medicaid Other

## 2016-02-11 DIAGNOSIS — Z3A25 25 weeks gestation of pregnancy: Secondary | ICD-10-CM | POA: Diagnosis not present

## 2016-02-11 DIAGNOSIS — B9689 Other specified bacterial agents as the cause of diseases classified elsewhere: Secondary | ICD-10-CM

## 2016-02-11 DIAGNOSIS — D649 Anemia, unspecified: Secondary | ICD-10-CM | POA: Diagnosis not present

## 2016-02-11 DIAGNOSIS — O4692 Antepartum hemorrhage, unspecified, second trimester: Secondary | ICD-10-CM | POA: Diagnosis not present

## 2016-02-11 DIAGNOSIS — O26899 Other specified pregnancy related conditions, unspecified trimester: Secondary | ICD-10-CM

## 2016-02-11 DIAGNOSIS — Z79899 Other long term (current) drug therapy: Secondary | ICD-10-CM | POA: Insufficient documentation

## 2016-02-11 DIAGNOSIS — R109 Unspecified abdominal pain: Secondary | ICD-10-CM

## 2016-02-11 DIAGNOSIS — F17210 Nicotine dependence, cigarettes, uncomplicated: Secondary | ICD-10-CM | POA: Insufficient documentation

## 2016-02-11 DIAGNOSIS — O26892 Other specified pregnancy related conditions, second trimester: Secondary | ICD-10-CM | POA: Insufficient documentation

## 2016-02-11 DIAGNOSIS — O0932 Supervision of pregnancy with insufficient antenatal care, second trimester: Secondary | ICD-10-CM | POA: Diagnosis not present

## 2016-02-11 DIAGNOSIS — N760 Acute vaginitis: Secondary | ICD-10-CM | POA: Diagnosis not present

## 2016-02-11 DIAGNOSIS — O9989 Other specified diseases and conditions complicating pregnancy, childbirth and the puerperium: Secondary | ICD-10-CM | POA: Insufficient documentation

## 2016-02-11 DIAGNOSIS — O99332 Smoking (tobacco) complicating pregnancy, second trimester: Secondary | ICD-10-CM | POA: Diagnosis not present

## 2016-02-11 DIAGNOSIS — R51 Headache: Secondary | ICD-10-CM

## 2016-02-11 DIAGNOSIS — O99012 Anemia complicating pregnancy, second trimester: Secondary | ICD-10-CM

## 2016-02-11 LAB — URINE MICROSCOPIC-ADD ON

## 2016-02-11 LAB — URINALYSIS, ROUTINE W REFLEX MICROSCOPIC
BILIRUBIN URINE: NEGATIVE
Glucose, UA: NEGATIVE mg/dL
KETONES UR: NEGATIVE mg/dL
Nitrite: NEGATIVE
Protein, ur: NEGATIVE mg/dL
Specific Gravity, Urine: 1.02 (ref 1.005–1.030)
pH: 6.5 (ref 5.0–8.0)

## 2016-02-11 LAB — WET PREP, GENITAL
Sperm: NONE SEEN
Trich, Wet Prep: NONE SEEN
Yeast Wet Prep HPF POC: NONE SEEN

## 2016-02-11 LAB — CBC
HEMATOCRIT: 30 % — AB (ref 36.0–46.0)
Hemoglobin: 10 g/dL — ABNORMAL LOW (ref 12.0–15.0)
MCH: 29.6 pg (ref 26.0–34.0)
MCHC: 33.3 g/dL (ref 30.0–36.0)
MCV: 88.8 fL (ref 78.0–100.0)
Platelets: 182 10*3/uL (ref 150–400)
RBC: 3.38 MIL/uL — ABNORMAL LOW (ref 3.87–5.11)
RDW: 13.2 % (ref 11.5–15.5)
WBC: 4.2 10*3/uL (ref 4.0–10.5)

## 2016-02-11 MED ORDER — FERROUS SULFATE 325 (65 FE) MG PO TABS: 325.0000 mg | ORAL_TABLET | Freq: Every day | ORAL | Status: AC

## 2016-02-11 MED ORDER — METRONIDAZOLE 500 MG PO TABS: 500.0000 mg | ORAL_TABLET | Freq: Two times a day (BID) | ORAL | Status: DC

## 2016-02-11 MED ORDER — DIPHENHYDRAMINE HCL 50 MG/ML IJ SOLN
25.0000 mg | Freq: Once | INTRAMUSCULAR | Status: AC
  Administered 2016-02-11: 25 mg via INTRAVENOUS
  Filled 2016-02-11: qty 1

## 2016-02-11 MED ORDER — METOCLOPRAMIDE HCL 5 MG/ML IJ SOLN
10.0000 mg | Freq: Once | INTRAMUSCULAR | Status: AC
  Administered 2016-02-11: 10 mg via INTRAVENOUS
  Filled 2016-02-11: qty 2

## 2016-02-11 MED ORDER — SODIUM CHLORIDE 0.9 % IV SOLN
INTRAVENOUS | Status: DC
  Administered 2016-02-11: 17:00:00 via INTRAVENOUS

## 2016-02-11 MED ORDER — DEXAMETHASONE SODIUM PHOSPHATE 10 MG/ML IJ SOLN
10.0000 mg | Freq: Once | INTRAMUSCULAR | Status: AC
  Administered 2016-02-11: 10 mg via INTRAVENOUS
  Filled 2016-02-11: qty 1

## 2016-02-11 NOTE — MAU Note (Signed)
Had positive pregnancy test about one month ago, had been breast feeding, last period was around Thanksgiving last year. Feeling movement. Patient has headache, lower back pain, lower abdominal pain.

## 2016-02-11 NOTE — Discharge Instructions (Signed)
Second Trimester of Pregnancy The second trimester is from week 13 through week 28, month 4 through 6. This is often the time in pregnancy that you feel your best. Often times, morning sickness has lessened or quit. You may have more energy, and you may get hungry more often. Your unborn baby (fetus) is growing rapidly. At the end of the sixth month, he or she is about 9 inches long and weighs about 1 pounds. You will likely feel the baby move (quickening) between 18 and 20 weeks of pregnancy. HOME CARE   Avoid all smoking, herbs, and alcohol. Avoid drugs not approved by your doctor.  Do not use any tobacco products, including cigarettes, chewing tobacco, and electronic cigarettes. If you need help quitting, ask your doctor. You may get counseling or other support to help you quit.  Only take medicine as told by your doctor. Some medicines are safe and some are not during pregnancy.  Exercise only as told by your doctor. Stop exercising if you start having cramps.  Eat regular, healthy meals.  Wear a good support bra if your breasts are tender.  Do not use hot tubs, steam rooms, or saunas.  Wear your seat belt when driving.  Avoid raw meat, uncooked cheese, and liter boxes and soil used by cats.  Take your prenatal vitamins.  Take 1500-2000 milligrams of calcium daily starting at the 20th week of pregnancy until you deliver your baby.  Try taking medicine that helps you poop (stool softener) as needed, and if your doctor approves. Eat more fiber by eating fresh fruit, vegetables, and whole grains. Drink enough fluids to keep your pee (urine) clear or pale yellow.  Take warm water baths (sitz baths) to soothe pain or discomfort caused by hemorrhoids. Use hemorrhoid cream if your doctor approves.  If you have puffy, bulging veins (varicose veins), wear support hose. Raise (elevate) your feet for 15 minutes, 3-4 times a day. Limit salt in your diet.  Avoid heavy lifting, wear low heals,  and sit up straight.  Rest with your legs raised if you have leg cramps or low back pain.  Visit your dentist if you have not gone during your pregnancy. Use a soft toothbrush to brush your teeth. Be gentle when you floss.  You can have sex (intercourse) unless your doctor tells you not to.  Go to your doctor visits. GET HELP IF:   You feel dizzy.  You have mild cramps or pressure in your lower belly (abdomen).  You have a nagging pain in your belly area.  You continue to feel sick to your stomach (nauseous), throw up (vomit), or have watery poop (diarrhea).  You have bad smelling fluid coming from your vagina.  You have pain with peeing (urination). GET HELP RIGHT AWAY IF:   You have a fever.  You are leaking fluid from your vagina.  You have spotting or bleeding from your vagina.  You have severe belly cramping or pain.  You lose or gain weight rapidly.  You have trouble catching your breath and have chest pain.  You notice sudden or extreme puffiness (swelling) of your face, hands, ankles, feet, or legs.  You have not felt the baby move in over an hour.  You have severe headaches that do not go away with medicine.  You have vision changes.   This information is not intended to replace advice given to you by your health care provider. Make sure you discuss any questions you have with your  health care provider.   Document Released: 12/06/2009 Document Revised: 10/02/2014 Document Reviewed: 11/12/2012 Elsevier Interactive Patient Education 2016 Elsevier Inc. General Headache Without Cause A headache is pain or discomfort felt around the head or neck area. The specific cause of a headache may not be found. There are many causes and types of headaches. A few common ones are:  Tension headaches.  Migraine headaches.  Cluster headaches.  Chronic daily headaches. HOME CARE INSTRUCTIONS  Watch your condition for any changes. Take these steps to help with your  condition: Managing Pain  Take over-the-counter and prescription medicines only as told by your health care provider.  Lie down in a dark, quiet room when you have a headache.  If directed, apply ice to the head and neck area:  Put ice in a plastic bag.  Place a towel between your skin and the bag.  Leave the ice on for 20 minutes, 2-3 times per day.  Use a heating pad or hot shower to apply heat to the head and neck area as told by your health care provider.  Keep lights dim if bright lights bother you or make your headaches worse. Eating and Drinking  Eat meals on a regular schedule.  Limit alcohol use.  Decrease the amount of caffeine you drink, or stop drinking caffeine. General Instructions  Keep all follow-up visits as told by your health care provider. This is important.  Keep a headache journal to help find out what may trigger your headaches. For example, write down:  What you eat and drink.  How much sleep you get.  Any change to your diet or medicines.  Try massage or other relaxation techniques.  Limit stress.  Sit up straight, and do not tense your muscles.  Do not use tobacco products, including cigarettes, chewing tobacco, or e-cigarettes. If you need help quitting, ask your health care provider.  Exercise regularly as told by your health care provider.  Sleep on a regular schedule. Get 7-9 hours of sleep, or the amount recommended by your health care provider. SEEK MEDICAL CARE IF:   Your symptoms are not helped by medicine.  You have a headache that is different from the usual headache.  You have nausea or you vomit.  You have a fever. SEEK IMMEDIATE MEDICAL CARE IF:   Your headache becomes severe.  You have repeated vomiting.  You have a stiff neck.  You have a loss of vision.  You have problems with speech.  You have pain in the eye or ear.  You have muscular weakness or loss of muscle control.  You lose your balance or have  trouble walking.  You feel faint or pass out.  You have confusion.   This information is not intended to replace advice given to you by your health care provider. Make sure you discuss any questions you have with your health care provider.   Document Released: 09/11/2005 Document Revised: 06/02/2015 Document Reviewed: 01/04/2015 Elsevier Interactive Patient Education 2016 [employer]. Pregnancy and Anemia Anemia is a condition in which the concentration of red blood cells or hemoglobin in the blood is below normal. Hemoglobin is a substance in red blood cells that carries oxygen to the tissues of the body. Anemia results in not enough oxygen reaching these tissues.  Anemia during pregnancy is common because the fetus uses more iron and folic acid as it is developing. Your body may not produce enough red blood cells because of this. Also, during pregnancy, the liquid part  of the blood (plasma) increases by about 50%, and the red blood cells increase by only 25%. This lowers the concentration of the red blood cells and creates a natural anemia-like situation.  CAUSES  The most common cause of anemia during pregnancy is not having enough iron in the body to make red blood cells (iron deficiency anemia). Other causes may include:  Folic acid deficiency.  Vitamin B12 deficiency.  Certain prescription or over-the-counter medicines.  Certain medical conditions or infections that destroy red blood cells.  A low platelet count and bleeding caused by antibodies that go through the placenta to the fetus from the mother's blood. SIGNS AND SYMPTOMS  Mild anemia may not be noticeable. If it becomes severe, symptoms may include:  Tiredness.  Shortness of breath, especially with exercise.  Weakness.  Fainting.  Pale looking skin.  Headaches.  Feeling a fast or irregular heartbeat (palpitations). DIAGNOSIS  The type of anemia is usually diagnosed from your family and medical history and  blood tests. TREATMENT  Treatment of anemia during pregnancy depends on the cause of the anemia. Treatment can include:  Supplements of iron, vitamin B12, or folic acid.  A blood transfusion. This may be needed if blood loss is severe.  Hospitalization. This may be needed if there is significant continual blood loss.  Dietary changes. HOME CARE INSTRUCTIONS   Follow your dietitian's or health care provider's dietary recommendations.  Increase your vitamin C intake. This will help the stomach absorb more iron.  Eat a diet rich in iron. This would include foods such as:  Liver.  Beef.  Whole grain bread.  Eggs.  Dried fruit.  Take iron and vitamins as directed by your health care provider.  Eat green leafy vegetables. These are a good source of folic acid. SEEK MEDICAL CARE IF:   You have frequent or lasting headaches.  You are looking pale.  You are bruising easily. SEEK IMMEDIATE MEDICAL CARE IF:   You have extreme weakness, shortness of breath, or chest pain.  You become dizzy or have trouble concentrating.  You have heavy vaginal bleeding.  You develop a rash.  You have bloody or black, tarry stools.  You faint.  You vomit up blood.  You vomit repeatedly.  You have abdominal pain.  You have a fever or persistent symptoms for more than 2-3 days.  You have a fever and your symptoms suddenly get worse.  You are dehydrated. MAKE SURE YOU:   Understand these instructions.  Will watch your condition.  Will get help right away if you are not doing well or get worse.   This information is not intended to replace advice given to you by your health care provider. Make sure you discuss any questions you have with your health care provider.   Document Released: 09/08/2000 Document Revised: 07/02/2013 Document Reviewed: 04/23/2013 Elsevier Interactive Patient Education [employer]2016 Elsevier Inc.

## 2016-02-11 NOTE — MAU Provider Note (Signed)
History     CSN: [id number]  Arrival date and time: 02/11/16 1442   First Provider Initiated Contact with Patient 02/11/16 [phone]       Chief Complaint  Patient presents with  . Abdominal Pain  . Headache   HPI  Tiffany Villegas is a 25 y.o. [id number] at 225w1d by LMP who presents with headache and abdominal pain. Patient has not had prenatal care yet as she didn't know how far along she was. First positive pregnancy test 1 month ago. Had a baby 04/2015 & was breastfeeding; LMP in November.  Reports symptoms began 2 days ago.  Constant low back and abdominal pain. Described as achy & rates 9/10. Frontal headache that has been constant x 2 days. Worse with loud noises. No photophobia. Rates 9/10. Has been taking 2 ES tylenol with minimal relief but then pain returns.  Denies n/v/d, constipation, dysuria.  Had episode of pink spotting on toilet paper today after BM. Unsure if blood was from rectum or vagina.   OB History    Gravida Para Term Preterm AB TAB SAB Ectopic Multiple Living   5 3 2 1 1 1  0 0 0 3      Past Medical History  Diagnosis Date  . Chlamydia 2014  . Trichomoniasis   . DEPRESSION, MAJOR 10/12/2009    Qualifier: Diagnosis of  By: Delrae AlfredMulberry MD, Lanora ManisElizabeth      Past Surgical History  Procedure Laterality Date  . Induced abortion      Family History  Problem Relation Age of Onset  . Hypertension Mother     Social History  Substance Use Topics  . Smoking status: Current Every Day Smoker -- 0.25 packs/day    Types: Cigarettes  . Smokeless tobacco: Never Used  . Alcohol Use: No     Comment: occasional    Allergies: No Known Allergies  Prescriptions prior to admission  Medication Sig Dispense Refill Last Dose  . acetaminophen (TYLENOL) 500 MG tablet Take 500 mg by mouth every 6 (six) hours as needed for moderate pain.   02/10/2016 at Unknown time  . calcium carbonate (TUMS - DOSED IN MG ELEMENTAL CALCIUM) 500 MG chewable tablet Chew 1-2 tablets by mouth at  bedtime as needed for indigestion or heartburn.   Past Week at Unknown time  . Prenatal Vit-Fe Fumarate-FA (PRENATAL MULTIVITAMIN) TABS tablet Take 1 tablet by mouth daily at 12 noon.   02/10/2016 at Unknown time    Review of Systems  Constitutional: Negative.   HENT: Negative for sore throat and tinnitus.   Eyes: Negative for blurred vision and photophobia.  Respiratory: Negative.   Cardiovascular: Negative.   Gastrointestinal: Positive for abdominal pain. Negative for nausea, vomiting, diarrhea and constipation.  Genitourinary: Negative for dysuria.  Neurological: Positive for headaches.   Physical Exam   Blood pressure 106/78, pulse 103, temperature 98.8 F (37.1 C), temperature source Oral, resp. rate 18, last menstrual period 08/19/2015, unknown if currently breastfeeding.  Physical Exam  Nursing note and vitals reviewed. Constitutional: She is oriented to person, place, and time. She appears well-developed and well-nourished. No distress.  HENT:  Head: Normocephalic and atraumatic.  Eyes: Conjunctivae are normal. Right eye exhibits no discharge. Left eye exhibits no discharge. No scleral icterus.  Neck: Normal range of motion.  Cardiovascular: Normal rate, regular rhythm and normal heart sounds.   No murmur heard. Respiratory: Effort normal and breath sounds normal. No respiratory distress. She has no wheezes.  GI: Soft. Bowel sounds are normal.  There is no tenderness.  Genitourinary: Rectum normal. No bleeding in the vagina. Vaginal discharge (small amount of thin white discharge) found.  Cervix closed/thick/high  Neurological: She is alert and oriented to person, place, and time.  Skin: Skin is warm and dry. She is not diaphoretic.  Psychiatric: She has a normal mood and affect. Her behavior is normal. Judgment and thought content normal.    MAU Course  Procedures Results for orders placed or performed during the hospital encounter of 02/11/16 (from the past 24 hour(s))   Urinalysis, Routine w reflex microscopic (not at [facility])     Status: Abnormal   Collection Time: 02/11/16  3:10 PM  Result Value Ref Range   Color, Urine YELLOW YELLOW   APPearance CLEAR CLEAR   Specific Gravity, Urine 1.020 1.005 - 1.030   pH 6.5 5.0 - 8.0   Glucose, UA NEGATIVE NEGATIVE mg/dL   Hgb urine dipstick TRACE (A) NEGATIVE   Bilirubin Urine NEGATIVE NEGATIVE   Ketones, ur NEGATIVE NEGATIVE mg/dL   Protein, ur NEGATIVE NEGATIVE mg/dL   Nitrite NEGATIVE NEGATIVE   Leukocytes, UA TRACE (A) NEGATIVE  Urine microscopic-add on     Status: Abnormal   Collection Time: 02/11/16  3:10 PM  Result Value Ref Range   Squamous Epithelial / LPF 0-5 (A) NONE SEEN   WBC, UA 0-5 0 - 5 WBC/hpf   RBC / HPF 0-5 0 - 5 RBC/hpf   Bacteria, UA FEW (A) NONE SEEN   Urine-Other MUCOUS PRESENT   CBC     Status: Abnormal   Collection Time: 02/11/16  4:45 PM  Result Value Ref Range   WBC 4.2 4.0 - 10.5 K/uL   RBC 3.38 (L) 3.87 - 5.11 MIL/uL   Hemoglobin 10.0 (L) 12.0 - 15.0 g/dL   HCT 95.2 (L) 84.1 - 32.4 %   MCV 88.8 78.0 - 100.0 fL   MCH 29.6 26.0 - 34.0 pg   MCHC 33.3 30.0 - 36.0 g/dL   RDW 40.1 02.7 - 25.3 %   Platelets 182 150 - 400 K/uL  Wet prep, genital     Status: Abnormal   Collection Time: 02/11/16  6:00 PM  Result Value Ref Range   Yeast Wet Prep HPF POC NONE SEEN NONE SEEN   Trich, Wet Prep NONE SEEN NONE SEEN   Clue Cells Wet Prep HPF POC PRESENT (A) NONE SEEN   WBC, Wet Prep HPF POC MANY (A) NONE SEEN   Sperm NONE SEEN     MDM AB positive Limited ultrasound to evaluate placenta - no previa Category 1 fetal tracing, no ctx Cervix closed Headache cocktail (IV fluids, decadron, reglan, benadryl) -- patient reports improvement in symptoms  Assessment and Plan  A: 1. Headache in pregnancy, antepartum, second trimester   2. [redacted] weeks gestation of pregnancy   3. No prenatal care in current pregnancy in second trimester   4. Abdominal pain affecting pregnancy   5. BV  (bacterial vaginosis)   6. Anemia affecting pregnancy in second trimester     P: Discharge home Msg sent to clinic for prenatal appt Rx flagyl & ferrous sulfate Discussed reasons to return to MAU Preterm labor precautions  Judeth Horn 02/11/2016, 4:01 PM

## 2016-02-12 LAB — HIV ANTIBODY (ROUTINE TESTING W REFLEX): HIV SCREEN 4TH GENERATION: NONREACTIVE

## 2016-02-14 LAB — GC/CHLAMYDIA PROBE AMP (~~LOC~~) NOT AT ARMC
Chlamydia: NEGATIVE
Neisseria Gonorrhea: NEGATIVE

## 2016-03-29 ENCOUNTER — Encounter: Payer: Medicaid Other | Admitting: Obstetrics and Gynecology

## 2016-05-09 ENCOUNTER — Inpatient Hospital Stay (HOSPITAL_COMMUNITY)
Admission: AD | Admit: 2016-05-09 | Discharge: 2016-05-09 | Disposition: A | Discharge status: Home / Self Care | Payer: Medicaid Other | Source: Ambulatory Visit | Attending: Family Medicine | Admitting: Family Medicine

## 2016-05-09 ENCOUNTER — Encounter (HOSPITAL_COMMUNITY): Payer: Self-pay | Admitting: *Deleted

## 2016-05-09 DIAGNOSIS — Z8249 Family history of ischemic heart disease and other diseases of the circulatory system: Secondary | ICD-10-CM | POA: Diagnosis not present

## 2016-05-09 DIAGNOSIS — N898 Other specified noninflammatory disorders of vagina: Secondary | ICD-10-CM

## 2016-05-09 DIAGNOSIS — D649 Anemia, unspecified: Secondary | ICD-10-CM | POA: Insufficient documentation

## 2016-05-09 DIAGNOSIS — Z3A37 37 weeks gestation of pregnancy: Secondary | ICD-10-CM | POA: Insufficient documentation

## 2016-05-09 DIAGNOSIS — Z8619 Personal history of other infectious and parasitic diseases: Secondary | ICD-10-CM | POA: Insufficient documentation

## 2016-05-09 DIAGNOSIS — O99333 Smoking (tobacco) complicating pregnancy, third trimester: Secondary | ICD-10-CM | POA: Diagnosis not present

## 2016-05-09 DIAGNOSIS — O471 False labor at or after 37 completed weeks of gestation: Secondary | ICD-10-CM | POA: Insufficient documentation

## 2016-05-09 DIAGNOSIS — F17210 Nicotine dependence, cigarettes, uncomplicated: Secondary | ICD-10-CM | POA: Diagnosis not present

## 2016-05-09 DIAGNOSIS — O99343 Other mental disorders complicating pregnancy, third trimester: Secondary | ICD-10-CM | POA: Diagnosis not present

## 2016-05-09 DIAGNOSIS — O479 False labor, unspecified: Secondary | ICD-10-CM

## 2016-05-09 DIAGNOSIS — O26893 Other specified pregnancy related conditions, third trimester: Secondary | ICD-10-CM

## 2016-05-09 DIAGNOSIS — F329 Major depressive disorder, single episode, unspecified: Secondary | ICD-10-CM | POA: Diagnosis not present

## 2016-05-09 HISTORY — DX: Anemia, unspecified: D64.9

## 2016-05-09 HISTORY — DX: Unspecified abnormal cytological findings in specimens from vagina: R87.629

## 2016-05-09 LAB — URINALYSIS, ROUTINE W REFLEX MICROSCOPIC
Bilirubin Urine: NEGATIVE
GLUCOSE, UA: NEGATIVE mg/dL
Hgb urine dipstick: NEGATIVE
Ketones, ur: NEGATIVE mg/dL
Nitrite: NEGATIVE
PH: 6.5 (ref 5.0–8.0)
PROTEIN: NEGATIVE mg/dL
Specific Gravity, Urine: 1.015 (ref 1.005–1.030)

## 2016-05-09 LAB — POCT FERN TEST

## 2016-05-09 LAB — URINE MICROSCOPIC-ADD ON

## 2016-05-09 NOTE — Discharge Instructions (Signed)
Vaginal Delivery °During delivery, your health care provider will help you give birth to your baby. During a vaginal delivery, you will work to push the baby out of your vagina. However, before you can push your baby out, a few things need to happen. The opening of your uterus (cervix) has to soften, thin out, and open up (dilate) all the way to 10 cm. Also, your baby has to move down from the uterus into your vagina.  °SIGNS OF LABOR  °Your health care provider will first need to make sure you are in labor. Signs of labor include:  °· Passing what is called the mucous plug before labor begins. This is a small amount of blood-stained mucus. °· Having regular, painful uterine contractions.   °· The time between contractions gets shorter.   °· The discomfort and pain gradually get more intense. °· Contraction pains get worse when walking and do not go away when resting.   °· Your cervix becomes thinner (effacement) and dilates. °BEFORE THE DELIVERY °Once you are in labor and admitted into the hospital or care center, your health care provider may do the following:  °· Perform a complete physical exam. °· Review any complications related to pregnancy or labor.  °· Check your blood pressure, pulse, temperature, and heart rate (vital signs).   °· Determine if, and when, the rupture of amniotic membranes occurred. °· Do a vaginal exam (using a sterile glove and lubricant) to determine:   °¨ The position (presentation) of the baby. Is the baby's head presenting first (vertex) in the birth canal (vagina), or are the feet or buttocks first (breech)?   °¨ The level (station) of the baby's head within the birth canal.   °¨ The effacement and dilatation of the cervix.   °· An electronic fetal monitor is usually placed on your abdomen when you first arrive. This is used to monitor your contractions and the baby's heart rate. °¨ When the monitor is on your abdomen (external fetal monitor), it can only pick up the frequency and  length of your contractions. It cannot tell the strength of your contractions. °¨ If it becomes necessary for your health care provider to know exactly how strong your contractions are or to see exactly what the baby's heart rate is doing, an internal monitor may be inserted into your vagina and uterus. Your health care provider will discuss the benefits and risks of using an internal monitor and obtain your permission before inserting the device. °¨ Continuous fetal monitoring may be needed if you have an epidural, are receiving certain medicines (such as oxytocin), or have pregnancy or labor complications. °· An IV access tube may be placed into a vein in your arm to deliver fluids and medicines if necessary. °THREE STAGES OF LABOR AND DELIVERY °Normal labor and delivery is divided into three stages. °First Stage °This stage starts when you begin to contract regularly and your cervix begins to efface and dilate. It ends when your cervix is completely open (fully dilated). The first stage is the longest stage of labor and can last from 3 hours to 15 hours.  °Several methods are available to help with labor pain. You and your health care provider will decide which option is best for you. Options include:  °· Opioid medicines. These are strong pain medicines that you can get through your IV tube or as a shot into your muscle. These medicines lessen pain but do not make it go away completely.  °· Epidural. A medicine is given through a thin tube that   is inserted in your back. The medicine numbs the lower part of your body and prevents any pain in that area. °· Paracervical pain medicine. This is an injection of an anesthetic on each side of your cervix.   °· You may request natural childbirth, which does not involve the use of pain medicines or an epidural during labor and delivery. Instead, you will use other things, such as breathing exercises, to help cope with the pain. °Second Stage °The second stage of labor  begins when your cervix is fully dilated at 10 cm. It continues until you push your baby down through the birth canal and the baby is born. This stage can take only minutes or several hours. °· The location of your baby's head as it moves through the birth canal is reported as a number called a station. If the baby's head has not started its descent, the station is described as being at minus 3 (-3). When your baby's head is at the zero station, it is at the middle of the birth canal and is engaged in the pelvis. The station of your baby helps indicate the progress of the second stage of labor. °· When your baby is born, your health care provider may hold the baby with his or her head lowered to prevent amniotic fluid, mucus, and blood from getting into the baby's lungs. The baby's mouth and nose may be suctioned with a small bulb syringe to remove any additional fluid. °· Your health care provider may then place the baby on your stomach. It is important to keep the baby from getting cold. To do this, the health care provider will dry the baby off, place the baby directly on your skin (with no blankets between you and the baby), and cover the baby with warm, dry blankets.   °· The umbilical cord is cut. °Third Stage °During the third stage of labor, your health care provider will deliver the placenta (afterbirth) and make sure your bleeding is under control. The delivery of the placenta usually takes about 5 minutes but can take up to 30 minutes. After the placenta is delivered, a medicine may be given either by IV or injection to help contract the uterus and control bleeding. If you are planning to breastfeed, you can try to do so now. °After you deliver the placenta, your uterus should contract and get very firm. If your uterus does not remain firm, your health care provider will massage it. This is important because the contraction of the uterus helps cut off bleeding at the site where the placenta was attached  to your uterus. If your uterus does not contract properly and stay firm, you may continue to bleed heavily. If there is a lot of bleeding, medicines may be given to contract the uterus and stop the bleeding.  °  °This information is not intended to replace advice given to you by your health care provider. Make sure you discuss any questions you have with your health care provider. °  °Document Released: 06/20/2008 Document Revised: 10/02/2014 Document Reviewed: 05/08/2012 °Elsevier Interactive Patient Education ©2016 [employer] ° °

## 2016-05-09 NOTE — MAU Note (Signed)
Pt states she has been experiencing lower abd and back pain x 3 days.  Pt also states she has had an increase in vaginal discharge and questions if her water may be broken.  States its a lot like when she had a small leak with her other pregnancies.  This morning about 0730 she woke up with a spot almost lemon sized on her panties.  Denies vaginal bleeding.  Good fetal movement.

## 2016-05-09 NOTE — MAU Provider Note (Signed)
Chief Complaint:  Rupture of Membranes and Labor Eval   None    HPI  Tiffany Villegas is a 25 y.o. [phone] at 10852w5dwho presents to maternity admissions reporting contractions and one episode of some vaginal discharge which was wet. She reports good fetal movement, denies vaginal bleeding, vaginal itching/burning, urinary symptoms, h/a, dizziness, n/v, diarrhea, constipation or fever/chills.  She denies headache, visual changes or RUQ abdominal pain.  [place] "but kind of want to deliver here"  I was asked to rule out rupture of membranes on this patient.  Past Medical History: Past Medical History:  Diagnosis Date  . Anemia   . Chlamydia 2014  . DEPRESSION, MAJOR 10/12/2009   Qualifier: Diagnosis of  By: Delrae AlfredMulberry MD, Lanora ManisElizabeth    . Trichomoniasis   . Vaginal Pap smear, abnormal     Past obstetric history: OB History  Gravida Para Term Preterm AB Living  5 3 2 1 1 3   SAB TAB Ectopic Multiple Live Births  0 1 0 0 3    # Outcome Date GA Lbr Len/2nd Weight Sex Delivery Anes PTL Lv  5 Current           4 Preterm 05/01/15 6487w0d 30:54 / 00:19 2.18 kg (4 lb 12.9 oz) F Vag-Spont EPI  LIV  3 Term 09/09/13 752w5d 08:00 / 00:20 2.665 kg (5 lb 14 oz) M Vag-Spont EPI  LIV  2 Term 12/20/07 3449w0d  3.033 kg (6 lb 11 oz) M Vag-Spont EPI  LIV     Birth Comments: No complications  1 TAB               Past Surgical History: Past Surgical History:  Procedure Laterality Date  . INDUCED ABORTION      Family History: Family History  Problem Relation Age of Onset  . Hypertension Mother     Social History: Social History  Substance Use Topics  . Smoking status: Current Every Day Smoker    Packs/day: 0.25    Types: Cigarettes  . Smokeless tobacco: Never Used  . Alcohol use No     Comment: occasional    Allergies: No Known Allergies  Meds:  Prescriptions Prior to Admission  Medication Sig Dispense Refill Last Dose  . acetaminophen (TYLENOL) 500 MG tablet Take  500 mg by mouth every 6 (six) hours as needed for moderate pain.   05/08/2016 at Unknown time  . calcium carbonate (TUMS - DOSED IN MG ELEMENTAL CALCIUM) 500 MG chewable tablet Chew 1-2 tablets by mouth at bedtime as needed for indigestion or heartburn.   Past Month at Unknown time  . ferrous sulfate 325 (65 FE) MG tablet Take 1 tablet (325 mg total) by mouth daily with breakfast. 30 tablet 3 Past Week at Unknown time  . Prenatal Vit-Fe Fumarate-FA (PRENATAL MULTIVITAMIN) TABS tablet Take 1 tablet by mouth daily at 12 noon.   05/08/2016 at Unknown time  . ranitidine (ZANTAC) 150 MG tablet Take 150 mg by mouth 2 (two) times daily.   05/08/2016 at Unknown time  . metroNIDAZOLE (FLAGYL) 500 MG tablet Take 1 tablet (500 mg total) by mouth 2 (two) times daily. (Patient not taking: Reported on 05/09/2016) 14 tablet 0 Not Taking at Unknown time    I have reviewed patient's Past Medical Hx, Surgical Hx, Family Hx, Social Hx, medications and allergies.   ROS:  Review of Systems  Constitutional: Negative for chills and fever.  Gastrointestinal: Positive for abdominal pain. Negative for constipation, diarrhea,  nausea and vomiting.  Genitourinary: Positive for vaginal discharge. Negative for dysuria and vaginal bleeding.  Neurological: Negative for dizziness.   Other systems negative  Physical Exam  Patient Vitals for the past 24 hrs:  BP Temp Temp src Pulse Resp SpO2  05/09/16 1348 121/72 98.2 F (36.8 C) Oral (!) 127 18 99 %   Constitutional: Well-developed, well-nourished female in no acute distress.  Cardiovascular: normal rate and rhythm Respiratory: normal effort, clear to auscultation bilaterally GI: Abd soft, non-tender, gravid appropriate for gestational age.   No rebound or guarding. MS: Extremities nontender, no edema, normal ROM Neurologic: Alert and oriented x 4.  GU: Neg CVAT.  PELVIC EXAM: Cervix pink, visually closed, without lesion, scant white creamy discharge, vaginal walls and  external genitalia normal   Dilation: 4 Effacement (%): 80 Cervical Position: Middle Station: -3 Presentation: Vertex Exam by:: Morrison Oldee Carter RN   (unchanged from >1 hour ago)  FHT:  Baseline 140 , moderate variability, accelerations present, no decelerations Contractions: q 15 mins Irregular  >>  Progressed to more frequent but not painful   Labs: Results for orders placed or performed during the hospital encounter of 05/09/16 (from the past 24 hour(s))  Urinalysis, Routine w reflex microscopic ([facility])     Status: Abnormal   Collection Time: 05/09/16  1:35 PM  Result Value Ref Range   Color, Urine YELLOW YELLOW   APPearance CLEAR CLEAR   Specific Gravity, Urine 1.015 1.005 - 1.030   pH 6.5 5.0 - 8.0   Glucose, UA NEGATIVE NEGATIVE mg/dL   Hgb urine dipstick NEGATIVE NEGATIVE   Bilirubin Urine NEGATIVE NEGATIVE   Ketones, ur NEGATIVE NEGATIVE mg/dL   Protein, ur NEGATIVE NEGATIVE mg/dL   Nitrite NEGATIVE NEGATIVE   Leukocytes, UA LARGE (A) NEGATIVE  Urine microscopic-add on     Status: Abnormal   Collection Time: 05/09/16  1:35 PM  Result Value Ref Range   Squamous Epithelial / LPF 6-30 (A) NONE SEEN   WBC, UA 6-30 0 - 5 WBC/hpf   RBC / HPF 0-5 0 - 5 RBC/hpf   Bacteria, UA MANY (A) NONE SEEN  Fern Test     Status: Normal (Preliminary result)   Collection Time: 05/09/16  2:24 PM  Result Value Ref Range   POCT Fern Test        Imaging:  No results found.  MAU Course/MDM: I have ordered labs and reviewed results.  NST reviewed Watched her for over 2 hours No change in cervix, though contractions more frequent No ROM  Assessment: SIUP at 5045w5d Prodromal contractions  Plan: Discharge home Labor precautions and fetal kick counts Follow up in Office for prenatal visits and recheck Pt stable at time of discharge. Advised to go to the hospital where she does prenatal care for labor checks if possible  Wynelle BourgeoisMarie Taura Lamarre CNM, MSN Certified  Nurse-Midwife 05/09/2016 3:43 PM

## 2016-12-16 ENCOUNTER — Encounter (HOSPITAL_COMMUNITY): Payer: Self-pay

## 2021-06-07 ENCOUNTER — Encounter: Payer: Self-pay | Admitting: Neurology

## 2021-06-17 NOTE — Progress Notes (Signed)
Assessment/Plan:     abnormal movements, with family history of Huntington's disease  -Today's movements are consistent only with tremor, likely essential tremor much worsened by anxiety.  -Patient very much wants to be tested for Huntington's disease, given family history.  She is worrying about it.  Long discussion with the patient today.   We discussed genetic testing and implications for the genetic testing, especially regarding life insurance.  if one applies for a life insurance policy and genetic testing has already been performed, the life insurer is entitled to know the test results. With regard to health insurance, a law  prohibits health insurers from "dropping" an individual because of the results of a genetic test.  However, the insurer may charge higher premiums.  Discussed options for paying out-of-pocket  -Discussed with patient that I really would recommend that she see a genetic counselor before we test her for Huntington's disease gene.  She has no stigmata of the disease currently, but certainly could have the gene.  We discussed how knowing that could alter her life or mental state.  We discussed that the genetic counselor at [facility] only sees patients prior to diagnosis, not at diagnosis or after diagnosis.  Ultimately, she declined this, stating that she really needs to know.  She thinks that her primary care physician drew these labs already.  That would be very unusual, but I certainly will check.             -Patient met with my social worker today.  Regardless of diagnosis, it is going to be very important that she gets her anxiety under control.  She was just started on very low-dose Risperdal and amantadine  -We will go ahead and do an MRI of the brain.    Subjective:   Tiffany Villegas was seen today in the movement disorders clinic for neurologic consultation at the request of Bonnita Hollow, MD.  The consultation is for the evaluation of abnormal involuntary  movements.  States that she has had "jerking" intermittently for years.  States that she has always had "bad nerves" and she will tremors in and out of the body.  States that she can't stop the tremors until she calms down.  States that she is just nervous all of the time and "my life is good so I am not sure.  No falls.  There is a fam hx of HD in her maternal grandmother, maternal uncle (he had no kids).  Her uncle was dx at about 66 y/o and died about 30 y/o  Her maternal aunt does not have Parkinsons Disease.  Pt thinks her mom has the symptoms of bad nerves and pt wonders if her mom has HD but didn't want to be tested.  Pt states none of her siblings have HD but pt is the oldest of 67.  States that there is a lot of stress in patient's life as her sister just tried to commit suicide and was dx with schizophrenia.    She saw her primary care on September 9 for the movements.  She was started on amantadine and risperidone.  Those are helping her nervous feeling.  She was thinking that her PCP has already drawn labs for Huntington's disease.   Neuroimaging of the brain has not previously been performed.      ALLERGIES:  No Known Allergies  CURRENT MEDICATIONS:  Current Outpatient Medications  Medication Instructions   acetaminophen (TYLENOL) 500 mg, Oral, Every 6 hours PRN  amantadine (SYMMETREL) 100 mg, Oral, 2 times daily   calcium carbonate (TUMS - DOSED IN MG ELEMENTAL CALCIUM) 500 MG chewable tablet 1-2 tablets, At bedtime PRN   ferrous sulfate 325 mg, Oral, Daily with breakfast   medroxyPROGESTERone Acetate 150 MG/ML SUSY Intramuscular, Every 3 months   Prenatal Vit-Fe Fumarate-FA (PRENATAL MULTIVITAMIN) TABS tablet 1 tablet, Daily   ranitidine (ZANTAC) 150 mg, 2 times daily   risperiDONE (RISPERDAL) 0.5 mg, Oral, Daily at bedtime    Objective:   VITALS:   Vitals:   06/22/21 0839  BP: 110/80  Pulse: 100  Resp: 18  SpO2: 99%  Weight: 144 lb (65.3 kg)  Height: 5' 3"  (1.6 m)     GEN:  The patient appears stated age and is in NAD.  She is anxious/nervous. HEENT:  Normocephalic, atraumatic.  The mucous membranes are moist. The superficial temporal arteries are without ropiness or tenderness. CV:  tachy.  regular Lungs:  CTAB Neck/HEME:  There are no carotid bruits bilaterally.  Neurological examination:  Orientation: The patient is alert and oriented x3.  Cranial nerves: There is good facial symmetry. Extraocular muscles are intact. The visual fields are full to confrontational testing. The speech is fluent and clear. Soft palate rises symmetrically and there is no tongue deviation. Hearing is intact to conversational tone. Sensation: Sensation is intact to light and pinprick throughout (facial, trunk, extremities). Vibration is intact at the bilateral big toe. There is no extinction with double simultaneous stimulation. There is no sensory dermatomal level identified. Motor: Strength is 5/5 in the bilateral upper and lower extremities.   Shoulder shrug is equal and symmetric.  There is no pronator drift. Deep tendon reflexes: Deep tendon reflexes are 2-/4 at the bilateral biceps, triceps, brachioradialis, patella and achilles. Plantar responses are downgoing bilaterally.  Movement examination: Tone: There is normal tone in the bilateral upper extremities.  The tone in the lower extremities is normal.  Abnormal movements: no chorea.  No rest tremor.  She has postural tremor and intention tremor that is low amplitude.   Coordination:  There is no decremation with RAM's, with any form of RAMS, including alternating supination and pronation of the forearm, hand opening and closing, finger taps, heel taps and toe taps. Gait and Station: The patient has no difficulty arising out of a deep-seated chair without the use of the hands. The patient's stride length is good.  She is able to ambulate in a tandem fashion. I have reviewed and interpreted the following labs  independently   Chemistry      Component Value Date/Time   NA 142 03/12/2014 1149   K 3.7 03/12/2014 1149   CL 102 03/12/2014 1149   CO2 27 12/06/2012 1654   BUN 9 03/12/2014 1149   CREATININE 1.00 03/12/2014 1149      Component Value Date/Time   CALCIUM 9.7 12/06/2012 1654   ALKPHOS 55 12/06/2012 1654   AST 18 12/06/2012 1654   ALT 12 12/06/2012 1654   BILITOT 1.5 (H) 12/06/2012 1654       Lab Results  Component Value Date   WBC 4.2 02/11/2016   HGB 10.0 (L) 02/11/2016   HCT 30.0 (L) 02/11/2016   MCV 88.8 02/11/2016   PLT 182 02/11/2016     Total time spent on today's visit was 45 minutes, including both face-to-face time and nonface-to-face time.  Time included that spent on review of records (prior notes available to me/labs/imaging if pertinent), discussing treatment and goals, answering patient's questions and  coordinating care.  Cc:  Bonnita Hollow, MD

## 2021-06-22 ENCOUNTER — Other Ambulatory Visit: Payer: Self-pay

## 2021-06-22 ENCOUNTER — Ambulatory Visit: Payer: Medicaid Other | Admitting: Neurology

## 2021-06-22 ENCOUNTER — Encounter: Payer: Self-pay | Admitting: Neurology

## 2021-06-22 VITALS — BP 110/80 | HR 100 | Resp 18 | Ht 63.0 in | Wt 144.0 lb

## 2021-06-22 DIAGNOSIS — R251 Tremor, unspecified: Secondary | ICD-10-CM

## 2021-06-22 DIAGNOSIS — G255 Other chorea: Secondary | ICD-10-CM | POA: Diagnosis not present

## 2021-06-22 NOTE — Patient Instructions (Signed)
We have sent a referral to [facility] for your MRI and they will call you directly to schedule your appointment. They are located at [address] If you need to contact them directly please call [phone].   

## 2021-07-28 ENCOUNTER — Other Ambulatory Visit: Payer: Medicaid Other

## 2022-05-26 ENCOUNTER — Telehealth: Payer: Self-pay | Admitting: Neurology

## 2022-05-26 NOTE — Telephone Encounter (Signed)
Patient seen and sent HD repeat expansion genetic testing on the patient that was done about 1 year ago (06/28/2021) and her CAG repeat numbers was 19 and she was negative for Huntington's.  Patient did not tell us this when she was seen.  Chelsea, if she has not had the [place] test drawn yet, she does not need to have that done, as it was a repeat of the same test.  She does not have Huntington's disease.  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  Nonetheless, this patient is asymptomatic.

## 2022-05-26 NOTE — Telephone Encounter (Signed)
Called pateint and she still wants to continue to get tested by [facility] if its a possibility to see if she gets a new response than before

## 2022-06-06 NOTE — Telephone Encounter (Signed)
Called patient and left detailed message that Dr. Arbutus Leas doesn't recommend that she do the testing since we have already gotten the labs from [facility]

## 2022-06-06 NOTE — Telephone Encounter (Signed)
Pt left another message, she mentioned blood work about parkinson's, not huntington's disease. She would like a call back.

## 2022-06-06 NOTE — Telephone Encounter (Signed)
Patient left a VM stating that she has not gotten a call about her blood  please call to let her know the status of this

## 2022-06-07 NOTE — Telephone Encounter (Signed)
Dr. Arbutus Leas do you want to do testing for Parkinson's for this patient. I know we generally don't use [facility] for this.

## 2022-06-07 NOTE — Telephone Encounter (Signed)
Called patient back and let her know Dr. Iona Beard recommendations to not do any further testing at this time

## 2022-06-22 ENCOUNTER — Telehealth: Payer: Self-pay

## 2022-06-22 NOTE — Telephone Encounter (Signed)
Pateint called with directions from her PCP to schedule a follow up appointment for an MRI that they had ordered . The office could see report but not the imaging. 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 Spoke to Dr. Carles Collet and she let me know that most of these cysts cause no issues and are benign I spoke to patient and she understood

## 2022-06-23 ENCOUNTER — Telehealth: Payer: Self-pay | Admitting: Neurology

## 2022-06-23 NOTE — Telephone Encounter (Signed)
Patient states her name needs the film from [facility]. The hospital stated that If the pts doctor MD Tat requested the film they would send it over. The fax number is [phone] [facility] neurology is where the film will be coming from

## 2022-06-26 ENCOUNTER — Telehealth: Payer: Self-pay | Admitting: Neurology

## 2022-06-26 NOTE — Telephone Encounter (Signed)
Patient called for MRI results from last week.

## 2022-06-26 NOTE — Telephone Encounter (Signed)
Called and left message that results were not in , that we will call when we get then in.
